# Patient Record
Sex: Female | Born: 1995 | Race: Black or African American | Hispanic: No | Marital: Single | Smoking: Current some day smoker
Health system: Southern US, Community
[De-identification: ages and names within clinical notes are randomized; demographics above are authoritative.]

## PROBLEM LIST (undated history)

## (undated) ENCOUNTER — Inpatient Hospital Stay (HOSPITAL_COMMUNITY): Payer: Self-pay

## (undated) DIAGNOSIS — B9689 Other specified bacterial agents as the cause of diseases classified elsewhere: Secondary | ICD-10-CM

## (undated) DIAGNOSIS — H6690 Otitis media, unspecified, unspecified ear: Secondary | ICD-10-CM

## (undated) DIAGNOSIS — F329 Major depressive disorder, single episode, unspecified: Secondary | ICD-10-CM

## (undated) DIAGNOSIS — F419 Anxiety disorder, unspecified: Secondary | ICD-10-CM

## (undated) DIAGNOSIS — N760 Acute vaginitis: Secondary | ICD-10-CM

## (undated) HISTORY — DX: Anxiety disorder, unspecified: F41.9

## (undated) HISTORY — DX: Major depressive disorder, single episode, unspecified: F32.9

## (undated) HISTORY — DX: Otitis media, unspecified, unspecified ear: H66.90

## (undated) HISTORY — PX: THERAPEUTIC ABORTION: SHX798

## (undated) HISTORY — PX: TYMPANOSTOMY TUBE PLACEMENT: SHX32

---
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
## 2016-04-04 ENCOUNTER — Other Ambulatory Visit: Payer: Self-pay | Admitting: Physician Assistant

## 2016-05-01 ENCOUNTER — Ambulatory Visit: Payer: Self-pay | Admitting: Physician Assistant

## 2016-05-04 ENCOUNTER — Ambulatory Visit: Payer: Self-pay | Admitting: Physician Assistant

## 2016-11-17 ENCOUNTER — Encounter: Payer: Self-pay | Admitting: Physician Assistant

## 2016-11-17 ENCOUNTER — Ambulatory Visit (INDEPENDENT_AMBULATORY_CARE_PROVIDER_SITE_OTHER): Payer: BC Managed Care – PPO | Admitting: Physician Assistant

## 2016-11-17 DIAGNOSIS — F331 Major depressive disorder, recurrent, moderate: Secondary | ICD-10-CM | POA: Diagnosis not present

## 2016-11-17 MED ORDER — SERTRALINE HCL 50 MG PO TABS: 50.0000 mg | ORAL_TABLET | Freq: Every day | ORAL | 1 refills | Status: DC

## 2016-11-18 ENCOUNTER — Encounter: Payer: Self-pay | Admitting: Physician Assistant
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
## 2016-12-16 ENCOUNTER — Ambulatory Visit: Payer: Self-pay

## 2016-12-18 ENCOUNTER — Encounter: Payer: Self-pay | Admitting: Physician Assistant

## 2016-12-18 ENCOUNTER — Ambulatory Visit (INDEPENDENT_AMBULATORY_CARE_PROVIDER_SITE_OTHER): Payer: BLUE CROSS/BLUE SHIELD | Admitting: Physician Assistant

## 2016-12-18 VITALS — BP 116/74 | HR 72 | Wt 266.0 lb

## 2016-12-18 DIAGNOSIS — Z23 Encounter for immunization: Secondary | ICD-10-CM | POA: Diagnosis not present

## 2016-12-18 DIAGNOSIS — L259 Unspecified contact dermatitis, unspecified cause: Secondary | ICD-10-CM | POA: Diagnosis not present

## 2016-12-18 DIAGNOSIS — F331 Major depressive disorder, recurrent, moderate: Secondary | ICD-10-CM | POA: Diagnosis not present

## 2016-12-18 MED ORDER — TRIAMCINOLONE ACETONIDE 0.1 % EX CREA: 1.0000 "application " | TOPICAL_CREAM | Freq: Two times a day (BID) | CUTANEOUS | 1 refills | Status: DC

## 2016-12-18 MED ORDER — SERTRALINE HCL 50 MG PO TABS: 50.0000 mg | ORAL_TABLET | Freq: Every day | ORAL | 2 refills | Status: DC

## 2016-12-18 NOTE — Patient Instructions (Signed)
   Contact Dermatitis Introduction Dermatitis is redness, soreness, and swelling (inflammation) of the skin. Contact dermatitis is a reaction to certain substances that touch the skin. You either touched something that irritated your skin, or you have allergies to something you touched. Follow these instructions at home: Skin Care  Moisturize your skin as needed.  Apply cool compresses to the affected areas.  Try taking a bath with:  Epsom salts. Follow the instructions on the package. You can get these at a pharmacy or grocery store.  Baking soda. Pour a small amount into the bath as told by your doctor.  Colloidal oatmeal. Follow the instructions on the package. You can get this at a pharmacy or grocery store.  Try applying baking soda paste to your skin. Stir water into baking soda until it looks like paste.  Do not scratch your skin.  Bathe less often.  Bathe in lukewarm water. Avoid using hot water. Medicines  Take or apply over-the-counter and prescription medicines only as told by your doctor.  If you were prescribed an antibiotic medicine, take or apply your antibiotic as told by your doctor. Do not stop taking the antibiotic even if your condition starts to get better. General instructions  Keep all follow-up visits as told by your doctor. This is important.  Avoid the substance that caused your reaction. If you do not know what caused it, keep a journal to try to track what caused it. Write down:  What you eat.  What cosmetic products you use.  What you drink.  What you wear in the affected area. This includes jewelry.  If you were given a bandage (dressing), take care of it as told by your doctor. This includes when to change and remove it. Contact a doctor if:  You do not get better with treatment.  Your condition gets worse.  You have signs of infection such as:  Swelling.  Tenderness.  Redness.  Soreness.  Warmth.  You have a fever.  You  have new symptoms. Get help right away if:  You have a very bad headache.  You have neck pain.  Your neck is stiff.  You throw up (vomit).  You feel very sleepy.  You see red streaks coming from the affected area.  Your bone or joint underneath the affected area becomes painful after the skin has healed.  The affected area turns darker.  You have trouble breathing. This information is not intended to replace advice given to you by your health care provider. Make sure you discuss any questions you have with your health care provider. Document Released: 09/13/2009 Document Revised: 04/23/2016 Document Reviewed: 04/03/2015  2017 Elsevier  

## 2016-12-18 NOTE — Progress Notes (Addendum)
   Subjective:    Patient ID: Kara Castillo, female    DOB: 04/11/1996, 21 y.o.   MRN: [id number]  HPI Patient is a 21yo female presenting to the clinic for evaluation of a rash around her neck that started 2 weeks ago.  Patient states the rash appeared after switching body wash to Zest.  Patient states it is itching and inflamed.  Patient states she has now been using Dove body wash.  Patient reports she has eczema on her right flexor surface of her elbow, but the rash on her neck appears different.  Patient states she has tried Eucerin cream, but she still has severe dryness in the area.    Patient reports her depression has improved since starting Zoloft 50mg .  Patient states she has tried exercising more and has been relaxing more and went on vacation.  Patient feels her dose is adequate at this time and does not want to increase her dose at this point.  PHQ-9: 12, GAD-7: 9.          Review of Systems  Skin: Positive for rash.  All other systems reviewed and are negative.      Objective:   Physical Exam  Constitutional: She is oriented to person, place, and time. She appears well-developed and well-nourished.  HENT:  Head: Normocephalic and atraumatic.  Neurological: She is alert and oriented to person, place, and time.  Skin: Skin is warm and dry. Rash (Around neck.  No signs of open lesions.  Appears to be contact dermatitis.) noted. There is erythema.  Psychiatric: She has a normal mood and affect. Her behavior is normal.  Nursing note and vitals reviewed.         Assessment & Plan:  Marland Kitchen.Marland Kitchen.Kara Castillo was seen today for rash on neck.  Diagnoses and all orders for this visit:  Contact dermatitis and eczema -     triamcinolone cream (KENALOG) 0.1 %; Apply 1 application topically 2 (two) times daily.  Major depressive disorder, recurrent episode, moderate (HCC) -     sertraline (ZOLOFT) 50 MG tablet; Take 1 tablet (50 mg total) by mouth daily.  Influenza vaccine needed -     Flu  Vaccine QUAD 36+ mos PF IM (Fluarix & Fluzone Quad PF)   Patient received flu shot today.   Patient was prescribed triamcinolone cream 0.1% for her contact dermatitis and eczema.  Informed patient she can mix this cream with Eucerin.  Side effect of skin atrophy explained.  Patient instructed to avoid scented body washes.  Discontinue Zest body wash. Patient informed that if her rash worsens to call back and she will be treated for a yeast infection.    Patient will continue on Zoloft 50mg .  Patient needs to follow-up in 3 months to reassess her depression.

## 2017-03-09 ENCOUNTER — Encounter: Payer: Self-pay | Admitting: Physician Assistant

## 2017-03-09 ENCOUNTER — Ambulatory Visit (INDEPENDENT_AMBULATORY_CARE_PROVIDER_SITE_OTHER): Payer: BLUE CROSS/BLUE SHIELD | Admitting: Physician Assistant

## 2017-03-09 VITALS — BP 113/70 | HR 64 | Ht 62.5 in | Wt 259.0 lb

## 2017-03-09 DIAGNOSIS — Z113 Encounter for screening for infections with a predominantly sexual mode of transmission: Secondary | ICD-10-CM | POA: Diagnosis not present

## 2017-03-09 DIAGNOSIS — Z23 Encounter for immunization: Secondary | ICD-10-CM | POA: Diagnosis not present

## 2017-03-09 DIAGNOSIS — F331 Major depressive disorder, recurrent, moderate: Secondary | ICD-10-CM | POA: Diagnosis not present

## 2017-03-09 LAB — WET PREP FOR TRICH, YEAST, CLUE
CLUE CELLS WET PREP: NONE SEEN
TRICH WET PREP: NONE SEEN
WBC WET PREP: NONE SEEN
Yeast Wet Prep HPF POC: NONE SEEN

## 2017-03-09 MED ORDER — SERTRALINE HCL 50 MG PO TABS: ORAL_TABLET | ORAL | 2 refills | Status: DC

## 2017-03-09 NOTE — Progress Notes (Signed)
   Subjective:    Patient ID: Kara Castillo, female    DOB: October 25, 1996, 21 y.o.   MRN: [id number]  HPI Pt comes to the clinic for annual STD screening.had sex with 2 new same sex partners over the last year. She does not use any birth control. She [facility] but was having some issues getting appt. No abdominal pain, vaginal discharge or itching.   She is doing better with depression. She is still struggling with motivation and sad feelings. She wants counseling but concerned about cost burden. Denies any suicidal or homicidal thoughts.    Review of Systems  All other systems reviewed and are negative.      Objective:   Physical Exam  Constitutional: She is oriented to person, place, and time. She appears well-developed and well-nourished.  HENT:  Head: Normocephalic and atraumatic.  Cardiovascular: Normal rate, regular rhythm and normal heart sounds.   Pulmonary/Chest: Effort normal and breath sounds normal.  Neurological: She is alert and oriented to person, place, and time.  Psychiatric: She has a normal mood and affect. Her behavior is normal.          Assessment & Plan:  Marland KitchenMarland KitchenDiagnoses and all orders for this visit:  Screening examination for STD (sexually transmitted disease) -     HIV antibody (with reflex) -     RPR -     HSV Type I/II IgG, IgMw/ reflex -     GC/Chlamydia Probe Amp -     WET PREP FOR TRICH, YEAST, CLUE  Major depressive disorder, recurrent episode, moderate (HCC) -     sertraline (ZOLOFT) 50 MG tablet; Take one and one-half tablet daily.  Need for Tdap vaccination -     Tdap vaccine greater than or equal to 7yo IM   .Marland Kitchen Depression screen [facility] 2/9 12/18/2016 03/12/2014  Decreased Interest 1 -  Down, Depressed, Hopeless 1 3  PHQ - 2 Score 2 3  Altered sleeping 3 2  Tired, decreased energy 1 2  Change in appetite 2 3  Feeling bad or failure about yourself  0 3  Trouble concentrating 3 3  Moving slowly or fidgety/restless 1 1   Suicidal thoughts 0 3  PHQ-9 Score 12 20   .Marland Kitchen GAD 7 : Generalized Anxiety Score 12/18/2016  Nervous, Anxious, on Edge 2  Control/stop worrying 1  Worry too much - different things 1  Trouble relaxing 1  Restless 2  Easily annoyed or irritable 2  Afraid - awful might happen 0  Total GAD 7 Score 9    Increased zoloft. Strongly encouraged counseling. Follow up in 3 months. [facility]iscussed [facility] that is cheaper.   STD panel ordered.

## 2017-03-09 NOTE — Progress Notes (Signed)
Call pt: negative for yeast, trich, clue cells.

## 2017-03-10 LAB — HSV(HERPES SIMPLEX VRS) I + II AB-IGG: HSV 2 Glycoprotein G Ab, IgG: <0.9 Index (ref <0.90)

## 2017-03-10 LAB — GC/CHLAMYDIA PROBE AMP
CT Probe RNA: NOT DETECTED
GC PROBE AMP APTIMA: NOT DETECTED

## 2017-03-10 LAB — RPR

## 2017-03-10 LAB — HIV ANTIBODY (ROUTINE TESTING W REFLEX): HIV: NONREACTIVE

## 2017-03-10 NOTE — Progress Notes (Signed)
Call pt: GC/chlamydia negative. HIV negative. Still waiting on HSV.

## 2017-03-12 LAB — HSV 1/2 AB (IGM), IFA W/RFLX TITER
HSV 1 IgM Screen: NEGATIVE
HSV 2 IGM SCREEN: NEGATIVE

## 2017-04-21 ENCOUNTER — Ambulatory Visit (INDEPENDENT_AMBULATORY_CARE_PROVIDER_SITE_OTHER): Payer: BLUE CROSS/BLUE SHIELD | Admitting: Physician Assistant

## 2017-04-21 ENCOUNTER — Encounter: Payer: Self-pay | Admitting: Physician Assistant

## 2017-04-21 VITALS — BP 116/72 | HR 63 | Ht 62.5 in | Wt 260.0 lb

## 2017-04-21 DIAGNOSIS — Z3009 Encounter for other general counseling and advice on contraception: Secondary | ICD-10-CM | POA: Diagnosis not present

## 2017-04-21 DIAGNOSIS — F331 Major depressive disorder, recurrent, moderate: Secondary | ICD-10-CM

## 2017-04-21 MED ORDER — ETONOGESTREL-ETHINYL ESTRADIOL 0.12-0.015 MG/24HR VA RING: VAGINAL_RING | VAGINAL | 12 refills | Status: DC

## 2017-04-21 MED ORDER — SERTRALINE HCL 50 MG PO TABS: ORAL_TABLET | ORAL | 1 refills | Status: DC

## 2017-04-21 NOTE — Progress Notes (Signed)
   Subjective:    Patient ID: Kara Castillo, female    DOB: 09/05/1996, 21 y.o.   MRN: [id number]  HPI Pt is a 21 yo female who presents to the clinic to discuss birth control options. She does not want to take a pill. She has never been on OCP before. She has no hx of blood clots or clotting disorders. She is in an hetersexual relationship and wishes for pregnancy prevention.   Her mood is doing really great on current dose of zoloft. She wishes for a 90 day supply. She is in school and feeling really happy.   .. Active Ambulatory Problems    Diagnosis Date Noted  . Maxillary sinusitis, acute 11/08/2013  . Major depressive disorder, recurrent episode, moderate (HCC) 11/17/2016   Resolved Ambulatory Problems    Diagnosis Date Noted  . No Resolved Ambulatory Problems   Past Medical History:  Diagnosis Date  . Otitis media       Review of Systems  All other systems reviewed and are negative.      Objective:   Physical Exam  Constitutional: She is oriented to person, place, and time. She appears well-developed and well-nourished.  Obese.   HENT:  Head: Normocephalic and atraumatic.  Cardiovascular: Normal rate, regular rhythm and normal heart sounds.   Pulmonary/Chest: Effort normal and breath sounds normal.  Neurological: She is alert and oriented to person, place, and time.  Psychiatric: She has a normal mood and affect. Her behavior is normal.          Assessment & Plan:  Marland Kitchen.Marland Kitchen.Diagnoses and all orders for this visit:  Birth control counseling -     etonogestrel-ethinyl estradiol (NUVARING) 0.12-0.015 MG/24HR vaginal ring; Insert vaginally and leave in place for 3 consecutive weeks, then remove for 1 week.  Major depressive disorder, recurrent episode, moderate (HCC) -     sertraline (ZOLOFT) 50 MG tablet; Take one and one-half tablet daily.   Discussed birth control options with IUD, OCP, depo shot, nexiplanon and nuva ring. Pt chose nuva ring. HO given. Discussed  how to use and when to start. Given rx to start after next cycle. Follow up as needed. Pt aware this does not protect against STD. Encouraged to wear condoms. Pt declined STD testing today.

## 2017-04-21 NOTE — Patient Instructions (Signed)
Ethinyl Estradiol; Etonogestrel vaginal ring What is this medicine? ETHINYL ESTRADIOL; ETONOGESTREL (ETH in il es tra DYE ole; et oh noe JES trel) vaginal ring is a flexible, vaginal ring used as a contraceptive (birth control method). This medicine combines two types of female hormones, an estrogen and a progestin. This ring is used to prevent ovulation and pregnancy. Each ring is effective for one month. This medicine may be used for other purposes; ask your health care provider or pharmacist if you have questions. COMMON BRAND NAME(S): NuvaRing What should I tell my health care provider before I take this medicine? They need to know if you have or ever had any of these conditions: -abnormal vaginal bleeding -blood vessel disease or blood clots -breast, cervical, endometrial, ovarian, liver, or uterine cancer -diabetes -gallbladder disease -heart disease or recent heart attack -high blood pressure -high cholesterol -kidney disease -liver disease -migraine headaches -stroke -systemic lupus erythematosus (SLE) -tobacco smoker -an unusual or allergic reaction to estrogens, progestins, other medicines, foods, dyes, or preservatives -pregnant or trying to get pregnant -breast-feeding How should I use this medicine? Insert the ring into your vagina as directed. Follow the directions on the prescription label. The ring will remain place for 3 weeks and is then removed for a 1-week break. A new ring is inserted 1 week after the last ring was removed, on the same day of the week. Check often to make sure the ring is still in place, especially before and after sexual intercourse. If the ring was out of the vagina for an unknown amount of time, you may not be protected from pregnancy. Perform a pregnancy test and call your doctor. Do not use more often than directed. A patient package insert for the product will be given with each prescription and refill. Read this sheet carefully each time. The  sheet may change frequently. Contact your pediatrician regarding the use of this medicine in children. Special care may be needed. This medicine has been used in female children who have started having menstrual periods. Overdosage: If you think you have taken too much of this medicine contact a poison control center or emergency room at once. NOTE: This medicine is only for you. Do not share this medicine with others. What if I miss a dose? You will need to replace your vaginal ring once a month as directed. If the ring should slip out, or if you leave it in longer or shorter than you should, contact your health care professional for advice. What may interact with this medicine? Do not take this medicine with the following medication: -dasabuvir; ombitasvir; paritaprevir; ritonavir -ombitasvir; paritaprevir; ritonavir This medicine may also interact with the following medications: -acetaminophen -antibiotics or medicines for infections, especially rifampin, rifabutin, rifapentine, and griseofulvin, and possibly penicillins or tetracyclines -aprepitant -ascorbic acid (vitamin C) -atorvastatin -barbiturate medicines, such as phenobarbital -bosentan -carbamazepine -caffeine -clofibrate -cyclosporine -dantrolene -doxercalciferol -felbamate -grapefruit juice -hydrocortisone -medicines for anxiety or sleeping problems, such as diazepam or temazepam -medicines for diabetes, including pioglitazone -modafinil -mycophenolate -nefazodone -oxcarbazepine -phenytoin -prednisolone -ritonavir or other medicines for HIV infection or AIDS -rosuvastatin -selegiline -soy isoflavones supplements -St. John's wort -tamoxifen or raloxifene -theophylline -thyroid hormones -topiramate -warfarin This list may not describe all possible interactions. Give your health care provider a list of all the medicines, herbs, non-prescription drugs, or dietary supplements you use. Also tell them if you smoke,  drink alcohol, or use illegal drugs. Some items may interact with your medicine. What should I watch for while using   this medicine? Visit your doctor or health care professional for regular checks on your progress. You will need a regular breast and pelvic exam and Pap smear while on this medicine. Use an additional method of contraception during the first cycle that you use this ring. Do not use a diaphragm or female condom, as the ring can interfere with these birth control methods and their proper placement. If you have any reason to think you are pregnant, stop using this medicine right away and contact your doctor or health care professional. If you are using this medicine for hormone related problems, it may take several cycles of use to see improvement in your condition. Smoking increases the risk of getting a blood clot or having a stroke while you are using hormonal birth control, especially if you are more than 20 years old. You are strongly advised not to smoke. This medicine can make your body retain fluid, making your fingers, hands, or ankles swell. Your blood pressure can go up. Contact your doctor or health care professional if you feel you are retaining fluid. This medicine can make you more sensitive to the sun. Keep out of the sun. If you cannot avoid being in the sun, wear protective clothing and use sunscreen. Do not use sun lamps or tanning beds/booths. If you wear contact lenses and notice visual changes, or if the lenses begin to feel uncomfortable, consult your eye care specialist. In some women, tenderness, swelling, or minor bleeding of the gums may occur. Notify your dentist if this happens. Brushing and flossing your teeth regularly may help limit this. See your dentist regularly and inform your dentist of the medicines you are taking. If you are going to have elective surgery, you may need to stop using this medicine before the surgery. Consult your health care professional  for advice. This medicine does not protect you against HIV infection (AIDS) or any other sexually transmitted diseases. What side effects may I notice from receiving this medicine? Side effects that you should report to your doctor or health care professional as soon as possible: -breast tissue changes or discharge -changes in vaginal bleeding during your period or between your periods -chest pain -coughing up blood -dizziness or fainting spells -headaches or migraines -leg, arm or groin pain -severe or sudden headaches -stomach pain (severe) -sudden shortness of breath -sudden loss of coordination, especially on one side of the body -speech problems -symptoms of vaginal infection like itching, irritation or unusual discharge -tenderness in the upper abdomen -vomiting -weakness or numbness in the arms or legs, especially on one side of the body -yellowing of the eyes or skin Side effects that usually do not require medical attention (report to your doctor or health care professional if they continue or are bothersome): -breakthrough bleeding and spotting that continues beyond the 3 initial cycles of pills -breast tenderness -mood changes, anxiety, depression, frustration, anger, or emotional outbursts -increased sensitivity to sun or ultraviolet light -nausea -skin rash, acne, or brown spots on the skin -weight gain (slight) This list may not describe all possible side effects. Call your doctor for medical advice about side effects. You may report side effects to FDA at 1-800-FDA-1088. Where should I keep my medicine? Keep out of the reach of children. Store at room temperature between 15 and 30 degrees C (59 and 86 degrees F) for up to 4 months. The product will expire after 4 months. Protect from light. Throw away any unused medicine after the expiration date. NOTE: This   sheet is a summary. It may not cover all possible information. If you have questions about this medicine, talk  to your doctor, pharmacist, or health care provider.  2018 Elsevier/Gold Standard (2016-07-24 17:00:31)  

## 2017-05-03 ENCOUNTER — Ambulatory Visit: Payer: BLUE CROSS/BLUE SHIELD | Admitting: Physician Assistant

## 2017-05-03 DIAGNOSIS — Z0189 Encounter for other specified special examinations: Secondary | ICD-10-CM

## 2017-05-11 ENCOUNTER — Ambulatory Visit: Payer: BLUE CROSS/BLUE SHIELD | Admitting: Physician Assistant

## 2017-05-12 ENCOUNTER — Ambulatory Visit: Payer: BLUE CROSS/BLUE SHIELD | Admitting: Physician Assistant

## 2017-05-14 ENCOUNTER — Ambulatory Visit (INDEPENDENT_AMBULATORY_CARE_PROVIDER_SITE_OTHER): Payer: BLUE CROSS/BLUE SHIELD | Admitting: Physician Assistant

## 2017-05-14 ENCOUNTER — Encounter: Payer: Self-pay | Admitting: Physician Assistant

## 2017-05-14 VITALS — BP 127/80 | HR 74 | Ht 62.5 in | Wt 254.0 lb

## 2017-05-14 DIAGNOSIS — R300 Dysuria: Secondary | ICD-10-CM | POA: Diagnosis not present

## 2017-05-14 DIAGNOSIS — L298 Other pruritus: Secondary | ICD-10-CM | POA: Diagnosis not present

## 2017-05-14 DIAGNOSIS — N898 Other specified noninflammatory disorders of vagina: Secondary | ICD-10-CM

## 2017-05-14 DIAGNOSIS — Z7251 High risk heterosexual behavior: Secondary | ICD-10-CM | POA: Diagnosis not present

## 2017-05-14 LAB — POCT URINALYSIS DIPSTICK
BILIRUBIN UA: NEGATIVE
Blood, UA: NEGATIVE
GLUCOSE UA: NEGATIVE
Ketones, UA: NEGATIVE
NITRITE UA: NEGATIVE
PH UA: 7 (ref 5.0–8.0)
Protein, UA: 30
Spec Grav, UA: 1.02 (ref 1.010–1.025)
Urobilinogen, UA: 1 E.U./dL

## 2017-05-14 LAB — WET PREP FOR TRICH, YEAST, CLUE
Trich, Wet Prep: NONE SEEN
Yeast Wet Prep HPF POC: NONE SEEN

## 2017-05-14 NOTE — Progress Notes (Signed)
   Subjective:    Patient ID: Kara Castillo, female    DOB: 06/20/1996, 21 y.o.   MRN: [id number]  HPI  Pt is a 21 yo female who presents to the clinic with vaginal discharge, itching, and some off and on dysuria. She had STD work up in April and was negative. She has had a new female sexual partner and concerned something could have changed. For 2 weeks she has had off and on discharge. She has a slight odor. Itching is more external. No abdominal pain, fever, chills, n/v/d.   .. Active Ambulatory Problems    Diagnosis Date Noted  . Maxillary sinusitis, acute 11/08/2013  . Major depressive disorder, recurrent episode, moderate (HCC) 11/17/2016   Resolved Ambulatory Problems    Diagnosis Date Noted  . No Resolved Ambulatory Problems   Past Medical History:  Diagnosis Date  . Otitis media        Review of Systems  All other systems reviewed and are negative.      Objective:   Physical Exam  Constitutional: She appears well-developed and well-nourished.  HENT:  Head: Normocephalic and atraumatic.  Cardiovascular: Normal rate, regular rhythm and normal heart sounds.   Pulmonary/Chest: Effort normal and breath sounds normal.  No CVA tenderness.   Abdominal: Soft. Bowel sounds are normal. She exhibits no distension and no mass. There is no tenderness. There is no rebound and no guarding.  Psychiatric: She has a normal mood and affect. Her behavior is normal.          Assessment & Plan:  Marland Kitchen.Marland Kitchen.Vita BarleyKiesha was seen today for vaginal itching and vaginal discharge.  Diagnoses and all orders for this visit:  Vaginal itching -     WET PREP FOR TRICH, YEAST, CLUE  Vaginal discharge -     WET PREP FOR TRICH, YEAST, CLUE -     GC/Chlamydia Probe Amp  Dysuria -     POCT urinalysis dipstick  High risk sexual behavior -     GC/Chlamydia Probe Amp    .Marland Kitchen. Results for orders placed or performed in visit on 05/14/17  POCT urinalysis dipstick  Result Value Ref Range   Color, UA  orange    Clarity, UA slightly cloudy    Glucose, UA neg    Bilirubin, UA neg    Ketones, UA neg    Spec Grav, UA 1.020 1.010 - 1.025   Blood, UA neg    pH, UA 7.0 5.0 - 8.0   Protein, UA 30    Urobilinogen, UA 1.0 0.2 or 1.0 E.U./dL   Nitrite, UA neg    Leukocytes, UA Trace (A) Negative   Not enough to culture. Patient is having on and off dysuria.   Wet prep showed BV. Treated with metronidazole.  Will check for GC/CT.  Encouraged condom usage.  Some increased discharge could be because of the nuva ring.

## 2017-05-14 NOTE — Progress Notes (Signed)
Call pt: BV. This is NOT an STD. This has to do with pH of vagina being to basic. Please send metronidazole 500mg  bid for 7 days. #14

## 2017-05-15 LAB — GC/CHLAMYDIA PROBE AMP
CT Probe RNA: NOT DETECTED
GC Probe RNA: NOT DETECTED

## 2017-05-17 ENCOUNTER — Other Ambulatory Visit: Payer: Self-pay

## 2017-05-17 MED ORDER — METRONIDAZOLE 500 MG PO TABS: 500.0000 mg | ORAL_TABLET | Freq: Two times a day (BID) | ORAL | 0 refills | Status: DC

## 2017-05-25 ENCOUNTER — Telehealth: Payer: Self-pay

## 2017-05-25 NOTE — Telephone Encounter (Signed)
Shouldn't be any interaction. NuvaRing may change the vaginal discharge enough to predispose to BV, but the issue should resolve with treatment. If persists or recurs can consider switching birth control.

## 2017-05-25 NOTE — Telephone Encounter (Signed)
Patient advised of recommendations.  

## 2017-05-25 NOTE — Telephone Encounter (Signed)
Kara Castillo called and asked if there is any interaction while taking Flagyl and having a Nuvaring in place. She was placed on Flagyl for BV. She was prescribed the Flagyl on 05/17/2017 and notified on the same day but she did not start the Flagyl until yesterday. Also would the Nuvaring cause contamination that would prolong the BV.

## 2017-06-07 ENCOUNTER — Ambulatory Visit: Payer: BLUE CROSS/BLUE SHIELD | Admitting: Physician Assistant

## 2017-06-08 ENCOUNTER — Ambulatory Visit (INDEPENDENT_AMBULATORY_CARE_PROVIDER_SITE_OTHER): Payer: BLUE CROSS/BLUE SHIELD | Admitting: Physician Assistant

## 2017-06-08 ENCOUNTER — Ambulatory Visit: Payer: Self-pay | Admitting: Physician Assistant

## 2017-06-08 ENCOUNTER — Encounter: Payer: Self-pay | Admitting: Physician Assistant

## 2017-06-08 VITALS — BP 102/66 | HR 68 | Temp 97.8°F | Wt 253.0 lb

## 2017-06-08 DIAGNOSIS — H66012 Acute suppurative otitis media with spontaneous rupture of ear drum, left ear: Secondary | ICD-10-CM

## 2017-06-08 MED ORDER — OFLOXACIN 0.3 % OT SOLN: 5.0000 [drp] | Freq: Two times a day (BID) | OTIC | 0 refills | Status: DC

## 2017-06-08 MED ORDER — AZITHROMYCIN 250 MG PO TABS: ORAL_TABLET | ORAL | 0 refills | Status: DC

## 2017-06-08 NOTE — Progress Notes (Signed)
HPI:                                                                Kara AngerKiesha Helm is a 21 y.o. female who [facility] today for left ear pain  Otalgia   There is pain in the left ear. This is a new problem. The current episode started in the past 7 days. The problem occurs constantly. The problem has been unchanged. There has been no fever. Associated symptoms include ear discharge (yesterday) and hearing loss. Pertinent negatives include no coughing, headaches, neck pain, rhinorrhea or sore throat. She has tried nothing for the symptoms. There is no history of a chronic ear infection, hearing loss or a tympanostomy tube.     Past Medical History:  Diagnosis Date  . Otitis media    Past Surgical History:  Procedure Laterality Date  . TYMPANOSTOMY TUBE PLACEMENT     Social History  Substance Use Topics  . Smoking status: Never Smoker  . Smokeless tobacco: Never Used  . Alcohol use Not on file   family history includes Hypertension in her maternal grandmother.  ROS: negative except as noted in the HPI  Medications: Current Outpatient Prescriptions  Medication Sig Dispense Refill  . etonogestrel-ethinyl estradiol (NUVARING) 0.12-0.015 MG/24HR vaginal ring Insert vaginally and leave in place for 3 consecutive weeks, then remove for 1 week. 1 each 12  . sertraline (ZOLOFT) 50 MG tablet Take one and one-half tablet daily. 135 tablet 1  . triamcinolone cream (KENALOG) 0.1 % Apply 1 application topically 2 (two) times daily. 30 g 1   No current facility-administered medications for this visit.    Allergies  Allergen Reactions  . Penicillins        Objective:  BP 102/66   Pulse 68   Temp 97.8 F (36.6 C) (Oral)   Wt 253 lb (114.8 kg)   SpO2 100%   BMI 45.54 kg/m  Gen: well-groomed, cooperative, not ill-appearing, no distress HEENT: normal conjunctiva, right external canal normal and TM clear, left external  ear without tragus or auricular tenderness, no mastoid tenderness, right TM erythematous with effusion Pulm: Normal work of breathing, normal phonation Neuro: alert and oriented x 3, EOM's intact, no tremor MSK: moving all extremities, normal gait and station, no peripheral edema Skin: warm, dry, intact; no rashes or lesions on exposed skin   No results found for this or any previous visit (from the past 72 hour(s)). No results found.    Assessment and Plan: 21 y.o. female with   1. Acute suppurative otitis media of left ear with spontaneous rupture of tympanic membrane, recurrence not specified - azithromycin (ZITHROMAX Z-PAK) 250 MG tablet; Take 2 tablets (500 mg) on  Day 1,  followed by 1 tablet (250 mg) once daily on Days 2 through 5.  Dispense: 6 tablet; Refill: 0 - ofloxacin (FLOXIN) 0.3 % OTIC solution; Place 5 drops into the left ear 2 (two) times daily.  Dispense: 5 mL; Refill: 0 - Ibuprofen 800mg  Q8H prn for pain - follow-up if hearing does not return to baseline  Patient education and anticipatory guidance given Patient agrees with treatment plan Follow-up as needed if symptoms worsen or fail to improve  Levonne Hubertharley E. Cummings PA-C

## 2017-06-08 NOTE — Patient Instructions (Addendum)
- Take antibiotic for 5 days as prescribed - Put 5 drops in your left ear twice a day for 10 days - Ibuprofen 800mg  every 8 hours as needed for pain. May also do Tylenol 1000mg  every 8 hours - Follow-up if hearing does not return to normal   Otitis Media, Adult Otitis media occurs when there is inflammation and fluid in the middle ear. Your middle ear is a part of the ear that contains bones for hearing as well as air that helps send sounds to your brain. What are the causes? This condition is caused by a blockage in the eustachian tube. This tube drains fluid from the ear to the back of the nose (nasopharynx). A blockage in this tube can be caused by an object or by swelling (edema) in the tube. Problems that can cause a blockage include:  A cold or other upper respiratory infection.  Allergies.  An irritant, such as tobacco smoke.  Enlarged adenoids. The adenoids are areas of soft tissue located high in the back of the throat, behind the nose and the roof of the mouth.  A mass in the nasopharynx.  Damage to the ear caused by pressure changes (barotrauma).  What are the signs or symptoms? Symptoms of this condition include:  Ear pain.  A fever.  Decreased hearing.  A headache.  Tiredness (lethargy).  Fluid leaking from the ear.  Ringing in the ear.  How is this diagnosed? This condition is diagnosed with a physical exam. During the exam your health care provider will use an instrument called an otoscope to look into your ear and check for redness, swelling, and fluid. He or she will also ask about your symptoms. Your health care provider may also order tests, such as:  A test to check the movement of the eardrum (pneumatic otoscopy). This test is done by squeezing a small amount of air into the ear.  A test that changes air pressure in the middle ear to check how well the eardrum moves and whether the eustachian tube is working (tympanogram).  How is this  treated? This condition usually goes away on its own within 3-5 days. But if the condition is caused by a bacteria infection and does not go away own its own, or keeps coming back, your health care provider may:  Prescribe antibiotic medicines to treat the infection.  Prescribe or recommend medicines to control pain.  Follow these instructions at home:  Take over-the-counter and prescription medicines only as told by your health care provider.  If you were prescribed an antibiotic medicine, take it as told by your health care provider. Do not stop taking the antibiotic even if you start to feel better.  Keep all follow-up visits as told by your health care provider. This is important. Contact a health care provider if:  You have bleeding from your nose.  There is a lump on your neck.  You are not getting better in 5 days.  You feel worse instead of better. Get help right away if:  You have severe pain that is not controlled with medicine.  You have swelling, redness, or pain around your ear.  You have stiffness in your neck.  A part of your face is paralyzed.  The bone behind your ear (mastoid) is tender when you touch it.  You develop a severe headache. Summary  Otitis media is redness, soreness, and swelling of the middle ear.  This condition usually goes away on its own within 3-5  days.  If the problem does not go away in 3-5 days, your health care provider may prescribe or recommend medicines to treat your symptoms.  If you were prescribed an antibiotic medicine, take it as told by your health care provider. This information is not intended to replace advice given to you by your health care provider. Make sure you discuss any questions you have with your health care provider. Document Released: 08/21/2004 Document Revised: 11/06/2016 Document Reviewed: 11/06/2016 Elsevier Interactive Patient Education  2017 [employer]

## 2017-09-03 ENCOUNTER — Ambulatory Visit: Payer: Self-pay

## 2017-09-08 ENCOUNTER — Ambulatory Visit: Payer: Self-pay

## 2017-09-14 ENCOUNTER — Ambulatory Visit: Payer: Self-pay

## 2017-09-15 ENCOUNTER — Ambulatory Visit (INDEPENDENT_AMBULATORY_CARE_PROVIDER_SITE_OTHER): Payer: Self-pay | Admitting: Family Medicine

## 2017-09-15 VITALS — BP 118/69 | HR 53 | Wt 255.0 lb

## 2017-09-15 DIAGNOSIS — Z111 Encounter for screening for respiratory tuberculosis: Secondary | ICD-10-CM

## 2017-09-15 NOTE — Progress Notes (Signed)
Pt in today for ppd placement for work.  Given in right forearm with no immediate complications.  Pt instructed to return in 48 hrs to ppd reading.

## 2017-09-17 ENCOUNTER — Ambulatory Visit (INDEPENDENT_AMBULATORY_CARE_PROVIDER_SITE_OTHER): Payer: Self-pay | Admitting: Sports Medicine

## 2017-09-17 VITALS — Wt 253.0 lb

## 2017-09-17 DIAGNOSIS — Z111 Encounter for screening for respiratory tuberculosis: Secondary | ICD-10-CM

## 2017-09-17 LAB — TB SKIN TEST
Induration: 0 mm
TB SKIN TEST: NEGATIVE

## 2017-09-17 NOTE — Progress Notes (Signed)
Patient is here for Tb skin read. TB  Placement was 48 hours ago. Skin test was negative. The patient did not have a form to be filled out.

## 2017-12-27 ENCOUNTER — Ambulatory Visit: Payer: Self-pay | Admitting: Physician Assistant

## 2017-12-27 DIAGNOSIS — Z0189 Encounter for other specified special examinations: Secondary | ICD-10-CM

## 2018-07-27 LAB — HM PAP SMEAR: HM Pap smear: ABNORMAL

## 2018-09-21 ENCOUNTER — Ambulatory Visit: Payer: Self-pay | Admitting: Physician Assistant

## 2018-10-05 ENCOUNTER — Encounter: Payer: Self-pay | Admitting: Physician Assistant

## 2018-10-05 ENCOUNTER — Telehealth: Payer: Self-pay | Admitting: Physician Assistant

## 2018-10-05 ENCOUNTER — Ambulatory Visit: Payer: BC Managed Care – PPO | Admitting: Physician Assistant

## 2018-10-05 ENCOUNTER — Other Ambulatory Visit: Payer: Self-pay | Admitting: Physician Assistant

## 2018-10-05 VITALS — BP 121/80 | HR 75 | Ht 62.5 in | Wt 277.0 lb

## 2018-10-05 DIAGNOSIS — R87610 Atypical squamous cells of undetermined significance on cytologic smear of cervix (ASC-US): Secondary | ICD-10-CM

## 2018-10-05 DIAGNOSIS — F331 Major depressive disorder, recurrent, moderate: Secondary | ICD-10-CM | POA: Diagnosis not present

## 2018-10-05 DIAGNOSIS — Z3009 Encounter for other general counseling and advice on contraception: Secondary | ICD-10-CM

## 2018-10-05 DIAGNOSIS — R8290 Unspecified abnormal findings in urine: Secondary | ICD-10-CM | POA: Diagnosis not present

## 2018-10-05 DIAGNOSIS — N898 Other specified noninflammatory disorders of vagina: Secondary | ICD-10-CM | POA: Diagnosis not present

## 2018-10-05 DIAGNOSIS — R1030 Lower abdominal pain, unspecified: Secondary | ICD-10-CM

## 2018-10-05 LAB — POCT URINALYSIS DIPSTICK
BILIRUBIN UA: NEGATIVE
GLUCOSE UA: NEGATIVE
Ketones, UA: NEGATIVE
NITRITE UA: NEGATIVE
Protein, UA: NEGATIVE
Urobilinogen, UA: 0.2 E.U./dL
pH, UA: 5.5 (ref 5.0–8.0)

## 2018-10-05 MED ORDER — SERTRALINE HCL 50 MG PO TABS
ORAL_TABLET | ORAL | 1 refills | Status: DC
Start: 2018-10-05 — End: 2019-05-24

## 2018-10-05 MED ORDER — NITROFURANTOIN MONOHYD MACRO 100 MG PO CAPS: 100.0000 mg | ORAL_CAPSULE | Freq: Two times a day (BID) | ORAL | 0 refills | Status: DC

## 2018-10-05 MED ORDER — ETONOGESTREL-ETHINYL ESTRADIOL 0.12-0.015 MG/24HR VA RING
VAGINAL_RING | VAGINAL | 12 refills | Status: DC
Start: 2018-10-05 — End: 2019-03-15

## 2018-10-05 MED ORDER — METRONIDAZOLE 500 MG PO TABS: 500.0000 mg | ORAL_TABLET | Freq: Two times a day (BID) | ORAL | 0 refills | Status: DC

## 2018-10-05 NOTE — Telephone Encounter (Signed)
Need to get pap report from [facility] [address]

## 2018-10-05 NOTE — Progress Notes (Signed)
Subjective:    Patient ID: Kara Castillo, female    DOB: 07-27-1996, 22 y.o.   MRN: [id number]  HPI  Pt is a 22 yo female who is sexually active presenting to the clinic with vaginal discharge, odor, abdominal pain, irritation.   She finished her menstrual cycle October 14 through 20th. She started having symptoms around 1 week ago with discharge and odor. She is now having abdominal pain as well. She uses nuva ring for birth control. She does need refill. Last pap this year. No fever, chills, body aches. C/O some urine odor.   Depression is doing well. She continues to go through counseling. No SI/HC. She is thinking about going back to school in the near future.   .. Active Ambulatory Problems    Diagnosis Date Noted  . Maxillary sinusitis, acute 11/08/2013  . Major depressive disorder, recurrent episode, moderate (HCC) 11/17/2016  . Birth control counseling 10/05/2018  . Lower abdominal pain 10/05/2018   Resolved Ambulatory Problems    Diagnosis Date Noted  . No Resolved Ambulatory Problems   Past Medical History:  Diagnosis Date  . Otitis media         Review of Systems See HPI.     Objective:   Physical Exam  Constitutional: She appears well-nourished.  Obese.   Cardiovascular: Normal rate.  Pulmonary/Chest:  No CVA tenderness.   Abdominal: Soft. Bowel sounds are normal. She exhibits no distension. There is tenderness (suprapubic). There is no rebound and no guarding.  Neurological: She is alert.  Skin: No rash noted.          Assessment & Plan:  Marland KitchenMarland KitchenDiagnoses and all orders for this visit:  Vaginal itching -     metroNIDAZOLE (FLAGYL) 500 MG tablet; Take 1 tablet (500 mg total) by mouth 2 (two) times daily. For 7 days. -     Wet prep, genital -     C. trachomatis/N. gonorrhoeae RNA  Major depressive disorder, recurrent episode, moderate (HCC) -     sertraline (ZOLOFT) 50 MG tablet; Take one and one-half tablet daily.  Birth control counseling -      etonogestrel-ethinyl estradiol (NUVARING) 0.12-0.015 MG/24HR vaginal ring; Insert vaginally and leave in place for 3 consecutive weeks, then remove for 1 week.  Abnormal urine odor -     nitrofurantoin, macrocrystal-monohydrate, (MACROBID) 100 MG capsule; Take 1 capsule (100 mg total) by mouth 2 (two) times daily. For 7 days. -     POCT Urinalysis Dipstick -     Urine Culture  Lower abdominal pain -     nitrofurantoin, macrocrystal-monohydrate, (MACROBID) 100 MG capsule; Take 1 capsule (100 mg total) by mouth 2 (two) times daily. For 7 days. -     Wet prep, genital -     C. trachomatis/N. gonorrhoeae RNA  Vaginal discharge -     metroNIDAZOLE (FLAGYL) 500 MG tablet; Take 1 tablet (500 mg total) by mouth 2 (two) times daily. For 7 days. -     Wet prep, genital -     C. trachomatis/N. gonorrhoeae RNA    .Marland Kitchen Depression screen [facility] 2/9 10/05/2018 12/18/2016 03/12/2014  Decreased Interest 1 1 -  Down, Depressed, Hopeless 1 1 3   PHQ - 2 Score 2 2 3   Altered sleeping 1 3 2   Tired, decreased energy 1 1 2   Change in appetite 1 2 3   Feeling bad or failure about yourself  0 0 3  Trouble concentrating 0 3 3  Moving slowly or fidgety/restless  0 1 1  Suicidal thoughts 0 0 3  PHQ-9 Score 5 12 20   Difficult doing work/chores Not difficult at all - -    Results for orders placed or performed in visit on 10/05/18  POCT Urinalysis Dipstick  Result Value Ref Range   Color, UA yellow    Clarity, UA clear    Glucose, UA Negative Negative   Bilirubin, UA negative    Ketones, UA negative    Spec Grav, UA >=1.030 (A) 1.010 - 1.025   Blood, UA trace-lysed    pH, UA 5.5 5.0 - 8.0   Protein, UA Negative Negative   Urobilinogen, UA 0.2 0.2 or 1.0 E.U./dL   Nitrite, UA negative    Leukocytes, UA Small (1+) (A) Negative   Appearance     Odor     Will culture. Due to abdominal pain and symptoms started treatment with macrobid and will adjust as needed.   STI testing ordered. Wet prep  ordered.  Will treat empirically for BV and UTI.   Pap done this year with [facility]. Refilled nuva ring.   Depression controlled. Refilled zoloft.

## 2018-10-06 LAB — WET PREP FOR TRICH, YEAST, CLUE
MICRO NUMBER:: 91339288
SPECIMEN QUALITY 3963: ADEQUATE

## 2018-10-06 NOTE — Progress Notes (Signed)
Call pt: negative for any STI's.

## 2018-10-06 NOTE — Telephone Encounter (Signed)
Done. Report information sent to office.

## 2018-10-07 LAB — URINE CULTURE
MICRO NUMBER: 91339132
RESULT: NO GROWTH
SPECIMEN QUALITY: ADEQUATE

## 2018-10-07 LAB — C. TRACHOMATIS/N. GONORRHOEAE RNA
C. trachomatis RNA, TMA: NOT DETECTED
N. GONORRHOEAE RNA, TMA: NOT DETECTED

## 2018-10-07 NOTE — Progress Notes (Signed)
No growth on urine culture. How are symptoms?

## 2018-10-12 DIAGNOSIS — R87610 Atypical squamous cells of undetermined significance on cytologic smear of cervix (ASC-US): Secondary | ICD-10-CM | POA: Insufficient documentation

## 2018-10-12 NOTE — Telephone Encounter (Signed)
Call pt: pap results from other provider did show atypical cells of undetermined significance. Meaning not abnormal yet but we need to keep a closer eye on these cells follow up in 1 year for pap.

## 2018-10-13 NOTE — Telephone Encounter (Signed)
Called and notified patient of results and that her next pap will be in 1 year. No further questions or concerns at this time.

## 2018-10-21 ENCOUNTER — Encounter: Payer: Self-pay | Admitting: Physician Assistant

## 2018-10-21 ENCOUNTER — Ambulatory Visit (INDEPENDENT_AMBULATORY_CARE_PROVIDER_SITE_OTHER): Payer: BC Managed Care – PPO | Admitting: Physician Assistant

## 2018-10-21 VITALS — BP 119/76 | HR 92 | Ht 62.5 in | Wt 284.0 lb

## 2018-10-21 DIAGNOSIS — R42 Dizziness and giddiness: Secondary | ICD-10-CM | POA: Diagnosis not present

## 2018-10-21 DIAGNOSIS — R599 Enlarged lymph nodes, unspecified: Secondary | ICD-10-CM | POA: Diagnosis not present

## 2018-10-21 MED ORDER — FLUCONAZOLE 150 MG PO TABS: 150.0000 mg | ORAL_TABLET | Freq: Once | ORAL | 0 refills | Status: AC

## 2018-10-21 MED ORDER — FLUTICASONE PROPIONATE 50 MCG/ACT NA SUSP: 2.0000 | Freq: Every day | NASAL | 6 refills | Status: DC

## 2018-10-21 NOTE — Addendum Note (Signed)
Addended by: Jomarie LongsBREEBACK, JADE L on: 10/21/2018 12:02 PM   Modules accepted: Orders

## 2018-10-21 NOTE — Progress Notes (Signed)
   Subjective:    Patient ID: Kara Castillo, female    DOB: 05/28/1996, 22 y.o.   MRN: [id number]  HPI Patient is a 22 year old female presenting today with complaints of a painful lump in front of her ear and intermittent dizziness. The lump is small and tender to the touch, located just in front of and above her tragus. She also complains of intermittent dizziness, but denies any triggers. The dizziness does not make her nauseous or unstable, just makes her feel "off". She does note that it can happen when she stands up quickly, but also happens at random times as well. She also mentioned that her ears have felt "yucky" as of lately, but is not currently experiencing any pain or discharge.  .. Active Ambulatory Problems    Diagnosis Date Noted  . Maxillary sinusitis, acute 11/08/2013  . Major depressive disorder, recurrent episode, moderate (HCC) 11/17/2016  . Birth control counseling 10/05/2018  . Lower abdominal pain 10/05/2018  . Atypical squamous cells of undetermined significance (ASCUS) on Papanicolaou smear of cervix 10/12/2018   Resolved Ambulatory Problems    Diagnosis Date Noted  . No Resolved Ambulatory Problems   Past Medical History:  Diagnosis Date  . Otitis media      Review of Systems  Constitutional: Negative for chills, fatigue and fever.  HENT: Negative for congestion, ear discharge, ear pain, hearing loss, sinus pressure and sinus pain.   Neurological: Positive for dizziness. Negative for syncope.       Objective:   Physical Exam  Constitutional: She is oriented to person, place, and time. She appears well-developed and well-nourished.  HENT:  Head: Normocephalic and atraumatic.  Right Ear: External ear normal.  Left Ear: External ear normal.  Nose: Nose normal.  Mouth/Throat: No oropharyngeal exudate.  TM's clear. Scant amt of fluid behind TM bilaterally.  Negative for any tenderness over sinuses.  Bilateral enlarged tonsils but not exudate.  Small  pea sized firm slightly tender but not warm nodule just anterior to tragus of right ear.   Eyes: Conjunctivae are normal.  Neck: No thyromegaly present.  Cardiovascular: Normal rate and regular rhythm.  Pulmonary/Chest: Effort normal and breath sounds normal.  Lymphadenopathy:    She has no cervical adenopathy.  Neurological: She is alert and oriented to person, place, and time.  Negative Dix-hallpike bilaterally.   Psychiatric: She has a normal mood and affect. Her behavior is normal.          Assessment & Plan:  Marland Kitchen.Marland Kitchen.Diagnoses and all orders for this visit:  Lymph node enlargement  Dizziness -     fluticasone (FLONASE) 50 MCG/ACT nasal spray; Place 2 sprays into both nostrils daily.   I do think "bump" represents and pre-auricular lymph node. Dicussed warm compresses and ibuprofen for relief. Start flonase for some possible ETD. Negative Gilberto BetterDix Hallpike today in office. Certainly could be the beginning of URI. Consider tylenol cold sinus severe for URI symptoms. Follow up as needed. Try not to touch lymph node frequently. If not improving or worsening let me know.   Marland Kitchen.Harlon Flor.I, Taziah Difatta PA-C, have reviewed and agree with the above documentation in it's entirety.

## 2018-10-21 NOTE — Progress Notes (Signed)
Call pt: yeast and bacterial vaginosis. Will send over diflucan and metronidazole. I think this is an old result. I had already sent metronidazole. Diflucan added. Not sure what happened.

## 2018-11-11 NOTE — Telephone Encounter (Signed)
Marylene LandAngela, please abstract pap from [facility].

## 2018-11-14 ENCOUNTER — Encounter: Payer: Self-pay | Admitting: Physician Assistant

## 2018-11-14 NOTE — Telephone Encounter (Signed)
Abstracted from [facility]  

## 2018-11-15 ENCOUNTER — Ambulatory Visit: Payer: Self-pay | Admitting: Physician Assistant

## 2018-11-15 ENCOUNTER — Telehealth: Payer: Self-pay | Admitting: Physician Assistant

## 2018-11-15 NOTE — Telephone Encounter (Signed)
Patient called at 2:49 stating that this was her first opportunity to call about her appt today. She was stuck at work and unable to keep her appointment.

## 2018-11-15 NOTE — Telephone Encounter (Signed)
Ok to cancel so she doesn't get charged.

## 2018-11-28 ENCOUNTER — Ambulatory Visit (INDEPENDENT_AMBULATORY_CARE_PROVIDER_SITE_OTHER): Payer: BC Managed Care – PPO | Admitting: Physician Assistant

## 2018-11-28 ENCOUNTER — Encounter: Payer: Self-pay | Admitting: Physician Assistant

## 2018-11-28 VITALS — BP 123/77 | HR 75 | Ht 62.5 in | Wt 277.0 lb

## 2018-11-28 DIAGNOSIS — H6983 Other specified disorders of Eustachian tube, bilateral: Secondary | ICD-10-CM | POA: Diagnosis not present

## 2018-11-28 DIAGNOSIS — R599 Enlarged lymph nodes, unspecified: Secondary | ICD-10-CM

## 2018-11-28 MED ORDER — METHYLPREDNISOLONE 4 MG PO TBPK: ORAL_TABLET | ORAL | 0 refills | Status: DC

## 2018-11-28 NOTE — Progress Notes (Signed)
   Subjective:    Patient ID: Cassandria AngerKiesha Frisina, female    DOB: 11/21/1996, 22 y.o.   MRN: [id number]  HPI Patient is a 22 year old female who presents to the clinic to follow-up on lymph node in front of right ear. She was last seen on 11/22.  She admits she has been having this intermittently flare.  Today it is gone.  She denies any tenderness or bumps palpated today but last week she did have the same "bump" flare  She does report some bilateral ear popping and tight feeling.  She denies any sore throat, ear pain, headache.    .. Active Ambulatory Problems    Diagnosis Date Noted  . Maxillary sinusitis, acute 11/08/2013  . Major depressive disorder, recurrent episode, moderate (HCC) 11/17/2016  . Birth control counseling 10/05/2018  . Lower abdominal pain 10/05/2018  . Atypical squamous cells of undetermined significance (ASCUS) on Papanicolaou smear of cervix 10/12/2018   Resolved Ambulatory Problems    Diagnosis Date Noted  . No Resolved Ambulatory Problems   Past Medical History:  Diagnosis Date  . Otitis media       Review of Systems    see HPI.  Objective:   Physical Exam Vitals signs reviewed.  Constitutional:      Appearance: Normal appearance.  HENT:     Head: Normocephalic and atraumatic.     Right Ear: Ear canal and external ear normal.     Left Ear: Ear canal and external ear normal.     Ears:     Comments: Bilateral fluid behind TM. No signs of infection.     Nose: Nose normal.     Mouth/Throat:     Mouth: Mucous membranes are moist.  Cardiovascular:     Rate and Rhythm: Normal rate and regular rhythm.  Neurological:     General: No focal deficit present.     Mental Status: She is alert and oriented to person, place, and time.  Psychiatric:        Mood and Affect: Mood normal.        Behavior: Behavior normal.           Assessment & Plan:  Marland Kitchen.Marland Kitchen.Vita BarleyKiesha was seen today for follow-up.  Diagnoses and all orders for this visit:  Lymph node  enlargement -     methylPREDNISolone (MEDROL DOSEPAK) 4 MG TBPK tablet; Take as directed by package insert  ETD (Eustachian tube dysfunction), bilateral -     methylPREDNISolone (MEDROL DOSEPAK) 4 MG TBPK tablet; Take as directed by package insert  pt has signs and symptoms consisent with ETD. Treated with medrol dose pack and flonase.  Reassured patient about lymph node resolving.  Likely this is more a reactive lymph node in response to some of the eustachian tube dysfunction.  Continue to keep an eye on this.  Discussed warning and worrisome signs and symptoms of lymph nodes that will not resolve.  Follow-up as needed or with any new signs and symptoms.

## 2018-12-02 ENCOUNTER — Telehealth: Payer: Self-pay

## 2018-12-02 NOTE — Telephone Encounter (Signed)
Ok to write letter. To state patient has dx of MDD and treated with medications that have placed MDD in remission. Place in box and I will sign.

## 2018-12-02 NOTE — Telephone Encounter (Signed)
Patient called and is requesting a letter stating that she has been diagnosed with depression and that she is currently being treated by provider with medications. Routing to PCP to make sure no specific wording is needed or should be used. Thanks!

## 2018-12-05 NOTE — Telephone Encounter (Signed)
Signed and done 

## 2018-12-05 NOTE — Telephone Encounter (Signed)
Letter has been written and has been placed in box. I will contact patient to let her know it should be ready for pick-up by sometime this week. Thanks!

## 2018-12-09 ENCOUNTER — Encounter: Payer: Self-pay | Admitting: Physician Assistant

## 2018-12-09 ENCOUNTER — Ambulatory Visit (INDEPENDENT_AMBULATORY_CARE_PROVIDER_SITE_OTHER): Payer: BC Managed Care – PPO | Admitting: Physician Assistant

## 2018-12-09 VITALS — BP 143/78 | HR 66 | Temp 98.3°F | Ht 62.0 in | Wt 283.0 lb

## 2018-12-09 DIAGNOSIS — B9789 Other viral agents as the cause of diseases classified elsewhere: Secondary | ICD-10-CM

## 2018-12-09 DIAGNOSIS — J069 Acute upper respiratory infection, unspecified: Secondary | ICD-10-CM | POA: Diagnosis not present

## 2018-12-09 DIAGNOSIS — R1011 Right upper quadrant pain: Secondary | ICD-10-CM

## 2018-12-09 MED ORDER — HYDROCODONE-HOMATROPINE 5-1.5 MG/5ML PO SYRP: 5.0000 mL | ORAL_SOLUTION | Freq: Three times a day (TID) | ORAL | 0 refills | Status: DC | PRN

## 2018-12-09 MED ORDER — BENZONATATE 200 MG PO CAPS: 200.0000 mg | ORAL_CAPSULE | Freq: Two times a day (BID) | ORAL | 0 refills | Status: DC | PRN

## 2018-12-09 NOTE — Progress Notes (Signed)
Subjective:    Patient ID: Kara Castillo, female    DOB: Apr 28, 1996, 23 y.o.   MRN: [id number]  HPI: This is a 23 year old female who presents today with a chief complaint of a productive cough for 4 days. She states that the cough keeps her up at night and is interfering with sleep. She also complains of nausea, sore throat, sneezing, rhinorrhea, and mild diarrhea. 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 She denies fever, myalgia, chills, and fatigue. She has tried OTC tussin which has mildly helped her cough.   She also complains of abdominal pain 1 hour in duration. She has not eaten anything today and the pain is located in her RUQ. She has never experienced pain like this before. She denies any vomiting. She does have a family hx of family members getting their gallbladders removed.   .. Active Ambulatory Problems    Diagnosis Date Noted  . Maxillary sinusitis, acute 11/08/2013  . Major depressive disorder, recurrent episode, moderate (HCC) 11/17/2016  . Birth control counseling 10/05/2018  . Lower abdominal pain 10/05/2018  . Atypical squamous cells of undetermined significance (ASCUS) on Papanicolaou smear of cervix 10/12/2018   Resolved Ambulatory Problems    Diagnosis Date Noted  . No Resolved Ambulatory Problems   Past Medical History:  Diagnosis Date  . Otitis media       Review of Systems  Constitutional: Negative for activity change, appetite change, chills, fatigue and fever.  HENT: Positive for congestion, rhinorrhea, sneezing and sore throat.   Respiratory: Positive for cough. Negative for chest tightness, shortness of breath and wheezing.   Cardiovascular: Negative for chest pain.  Gastrointestinal: Positive for abdominal pain, diarrhea and nausea. Negative for constipation and vomiting.  Genitourinary: Negative for difficulty urinating and dysuria.  Musculoskeletal: Negative for arthralgias and myalgias.       Objective:   Physical Exam Constitutional:      Appearance: She is not ill-appearing.  HENT:     Right Ear: Tympanic membrane and ear canal normal.     Left Ear: Tympanic membrane and ear canal normal.     Nose: Congestion and rhinorrhea present.     Mouth/Throat:     Mouth: Mucous membranes are moist.     Pharynx: No pharyngeal swelling, oropharyngeal exudate, posterior oropharyngeal erythema or uvula swelling.     Tonsils: No tonsillar exudate. Swelling: 2+ on the right. 2+ on the left.  Eyes:     Conjunctiva/sclera: Conjunctivae normal.  Cardiovascular:     Heart sounds: Normal heart sounds.  Pulmonary:     Effort: Pulmonary effort is normal.     Breath sounds: Normal breath sounds. No wheezing or rhonchi.  Abdominal:     General: Bowel sounds are normal.     Palpations: Abdomen is soft.     Tenderness: There is abdominal tenderness.  Skin:    General: Skin is warm and dry.  Neurological:     Mental Status: She is alert.           Assessment & Plan:  Marland KitchenMarland KitchenLynna was seen today for sore throat and cough.  Diagnoses and all orders for this visit:  Viral URI with cough -     benzonatate (TESSALON) 200 MG capsule; Take 1 capsule (200 mg total) by mouth 2 (two) times daily as needed for cough. -     HYDROcodone-homatropine (HYCODAN) 5-1.5 MG/5ML syrup; Take 5 mLs by mouth every 8 (eight) hours as needed for  cough.  Right upper quadrant abdominal pain    Reassured appears viral no signs of bacterial infection for now.  -Tessalon 100mg  TID PRN for cough  -Hycodan 5mg /1.5mg  tab QHS for cough. Small quanity for bedtime relief for cough. -Oral rehydration.  -Contact office if symptoms not improved by Monday  RUQ pain: given patient history of coughing likely etiology, as well as recent changes in diet (vegan).  -discussed w/ patient possibility of pain being gall bladder in origin. However, pain is not induced after eating and is not associated with intense pain, vomiting, or bloating.  Instructed patient to follow up should she experience this type of pain again or to go to the ED should the pain  be increased/intense.   Marland Kitchen.Harlon Flor.I, [name] PA-C, have reviewed and agree with the above documentation in it's entirety.

## 2018-12-10 ENCOUNTER — Encounter: Payer: Self-pay | Admitting: Physician Assistant

## 2019-01-30 ENCOUNTER — Ambulatory Visit: Payer: Self-pay | Admitting: Physician Assistant

## 2019-01-31 ENCOUNTER — Encounter: Payer: Self-pay | Admitting: Physician Assistant

## 2019-01-31 ENCOUNTER — Ambulatory Visit (INDEPENDENT_AMBULATORY_CARE_PROVIDER_SITE_OTHER): Payer: 59 | Admitting: Physician Assistant

## 2019-01-31 VITALS — BP 122/75 | HR 81 | Temp 98.4°F | Wt 277.8 lb

## 2019-01-31 DIAGNOSIS — L309 Dermatitis, unspecified: Secondary | ICD-10-CM

## 2019-01-31 DIAGNOSIS — N898 Other specified noninflammatory disorders of vagina: Secondary | ICD-10-CM

## 2019-01-31 DIAGNOSIS — R0982 Postnasal drip: Secondary | ICD-10-CM | POA: Diagnosis not present

## 2019-01-31 DIAGNOSIS — Z113 Encounter for screening for infections with a predominantly sexual mode of transmission: Secondary | ICD-10-CM

## 2019-01-31 LAB — POCT URINE PREGNANCY: Preg Test, Ur: NEGATIVE

## 2019-01-31 MED ORDER — METRONIDAZOLE 500 MG PO TABS: 500.0000 mg | ORAL_TABLET | Freq: Two times a day (BID) | ORAL | 0 refills | Status: DC

## 2019-01-31 MED ORDER — CETIRIZINE-PSEUDOEPHEDRINE ER 5-120 MG PO TB12: 1.0000 | ORAL_TABLET | Freq: Two times a day (BID) | ORAL | 5 refills | Status: DC

## 2019-01-31 NOTE — Progress Notes (Signed)
Subjective:    Patient ID: Kara Castillo, female    DOB: March 15, 1996, 23 y.o.   MRN: [id number]  HPI Kara Castillo is a 23 y.o female that presents to the clinic today with change in vaginal odor and occasional increase in discharge. It began about two weeks ago. She endorses some lower pelvic cramping, pain with sex, and pruritis. She denies any burning, spotting or change in color of the discharge. She has had BV and yeast infections in the past with the most recent being 6 months ago. She has never had an STI before. She does report having a new sexual partner recently but states he does not have any STIs that she is aware of. Her last menstrual period was Feb 19th. She would like a pregnancy test because she feels like she has less energy recently and is sleeping more.  She also reports that she feels like she has been producing thicker mucus and that it is draining down her throat. She feels it tends to get stuck and she is constantly having to cough it up. She denies any nasal congestion, sinus pressure, headaches, sore throat or fever. She has never had seasonal allergies before.    Patient has some rough darker patches of skin on her neck and under her chin. She states it is sometimes itchy.   .. Active Ambulatory Problems    Diagnosis Date Noted  . Maxillary sinusitis, acute 11/08/2013  . Major depressive disorder, recurrent episode, moderate (HCC) 11/17/2016  . Birth control counseling 10/05/2018  . Lower abdominal pain 10/05/2018  . Atypical squamous cells of undetermined significance (ASCUS) on Papanicolaou smear of cervix 10/12/2018   Resolved Ambulatory Problems    Diagnosis Date Noted  . No Resolved Ambulatory Problems   Past Medical History:  Diagnosis Date  . Otitis media      Review of Systems  See HPI    Objective:   Physical Exam Exam conducted with a chaperone present.  Constitutional:      Appearance: Normal appearance.  HENT:     Head: Normocephalic.     Left  Ear: Tympanic membrane normal.     Ears:     Comments: Fluid bubbles behind right TM    Nose: No congestion or rhinorrhea.     Mouth/Throat:     Pharynx: Oropharynx is clear. No posterior oropharyngeal erythema.  Eyes:     Conjunctiva/sclera: Conjunctivae normal.  Cardiovascular:     Rate and Rhythm: Normal rate and regular rhythm.     Heart sounds: Normal heart sounds.  Pulmonary:     Breath sounds: Normal breath sounds.  Genitourinary:    Pubic Area: No rash.      Labia:        Right: No rash or lesion.        Left: No rash or lesion.      Vagina: Vaginal discharge and tenderness present.     Comments: Grey and white discharge present  Skin:    General: Skin is warm.     Capillary Refill: Capillary refill takes less than 2 seconds.     Comments: Scattered macular hyperpigement areas around neck.   Neurological:     General: No focal deficit present.     Mental Status: She is alert.  Psychiatric:        Mood and Affect: Mood normal.           Assessment & Plan:  Marland KitchenMarland KitchenDiagnoses and all orders for this visit:  Discharge from  the vagina -     POCT urine pregnancy -     metroNIDAZOLE (FLAGYL) 500 MG tablet; Take 1 tablet (500 mg total) by mouth 2 (two) times daily. -     SureSwab, Vaginosis/Vaginitis Plus  Screening examination for STD (sexually transmitted disease) -     HIV antibody (with reflex) -     RPR -     SureSwab, Vaginosis/Vaginitis Plus  Eczema, unspecified type  Post-nasal drip -     cetirizine-pseudoephedrine (ZYRTEC-D) 5-120 MG tablet; Take 1 tablet by mouth 2 (two) times daily.  suspect BV based on color of discharge, symptoms and hx of BV. Treated with Metronidazole (flagyl) 500 mg BID for 7 days Will screen for all STI's with surewab.  HIV and RPR in blood.   Suspect PND is allergic more than viral. Start  -Try Zyrtec daily for relief of symptoms - Continue using Flonase twice a day  Places on neck appear like eczema patches. -Mix a small  drop of triamcinolone with a moisturizer and apply to the spots of eczema. Discussed that it can cause hypopigmentation.

## 2019-01-31 NOTE — Progress Notes (Deleted)
d 

## 2019-02-01 LAB — HIV ANTIBODY (ROUTINE TESTING W REFLEX): HIV 1&2 Ab, 4th Generation: NONREACTIVE

## 2019-02-01 LAB — RPR: RPR Ser Ql: NONREACTIVE

## 2019-02-01 NOTE — Progress Notes (Signed)
HIV and RPR negative. Swab will take a few more days to result but will call you when we get results.Marland Kitchen

## 2019-02-02 LAB — SURESWAB, VAGINOSIS/VAGINITIS PLUS
Atopobium vaginae: 6.4 Log (cells/mL)
C. albicans, DNA: NOT DETECTED
C. glabrata, DNA: NOT DETECTED
C. parapsilosis, DNA: NOT DETECTED
C. trachomatis RNA, TMA: NOT DETECTED
C. tropicalis, DNA: NOT DETECTED
LACTOBACILLUS SPECIES: NOT DETECTED Log cells/mL
MEGASPHAERA SPECIES: 7.7 Log (cells/mL)
N. gonorrhoeae RNA, TMA: NOT DETECTED
TRICHOMONAS VAGINALIS RNA: DETECTED — AB

## 2019-02-03 NOTE — Progress Notes (Signed)
Call pt: positive for trichomoniasis and BV. Kara Castillo is a STI and sexual partner needs to be treated. Avoid intercourse until treated. Recheck in 3 months. Metronidazole should treat both that was given.

## 2019-02-23 ENCOUNTER — Other Ambulatory Visit: Payer: Self-pay

## 2019-02-23 DIAGNOSIS — Z3009 Encounter for other general counseling and advice on contraception: Secondary | ICD-10-CM

## 2019-03-14 ENCOUNTER — Encounter: Payer: Self-pay | Admitting: Physician Assistant

## 2019-03-14 ENCOUNTER — Ambulatory Visit (INDEPENDENT_AMBULATORY_CARE_PROVIDER_SITE_OTHER): Payer: 59 | Admitting: Physician Assistant

## 2019-03-14 VITALS — Temp 98.6°F | Wt 270.0 lb

## 2019-03-14 DIAGNOSIS — N898 Other specified noninflammatory disorders of vagina: Secondary | ICD-10-CM

## 2019-03-14 DIAGNOSIS — Z3041 Encounter for surveillance of contraceptive pills: Secondary | ICD-10-CM | POA: Diagnosis not present

## 2019-03-14 MED ORDER — NORETHIN ACE-ETH ESTRAD-FE 1-20 MG-MCG PO TABS: 1.0000 | ORAL_TABLET | Freq: Every day | ORAL | 11 refills | Status: DC

## 2019-03-14 MED ORDER — TINIDAZOLE 500 MG PO TABS: 2.0000 g | ORAL_TABLET | Freq: Every day | ORAL | 0 refills | Status: DC

## 2019-03-14 NOTE — Progress Notes (Signed)
Patient ID: Kara Castillo, female   DOB: [birth year], 23 y.o.   MRN: [id number] .Marland KitchenVirtual Visit via Video Note  I connected with Kara Castillo on 03/15/19 at  1:40 PM EDT by a video enabled telemedicine application and verified that I am speaking with the correct person using two identifiers.   I discussed the limitations of evaluation and management by telemedicine and the availability of in person appointments. The patient expressed understanding and agreed to proceed.  History of Present Illness: Pt is a 23 yo female who c/o vaginal discharge and irritation. She admits she never finished metronidazole from early march due to making her feel "nauseated and itching". She had positive trichomonas and bacterial vaginosis in early march on sureswab. Symptoms did get better but did not resolve and now back stronger. She has had intercourse since but with another partner. Vaginal discharge is thin and clear with some itching. Pt denies any abdominal pain, flank pain, painful sex, urinary symptoms. No fever or chills.   .. Active Ambulatory Problems    Diagnosis Date Noted  . Maxillary sinusitis, acute 11/08/2013  . Major depressive disorder, recurrent episode, moderate (HCC) 11/17/2016  . Birth control counseling 10/05/2018  . Lower abdominal pain 10/05/2018  . Atypical squamous cells of undetermined significance (ASCUS) on Papanicolaou smear of cervix 10/12/2018   Resolved Ambulatory Problems    Diagnosis Date Noted  . No Resolved Ambulatory Problems   Past Medical History:  Diagnosis Date  . Otitis media    Reviewed allergy, problem, med list.    Observations/Objective: No acute distress. Normal HR.  Normal Respirations NOrmal mood.    Today's Vitals   03/14/19 1336  Temp: 98.6 F (37 C)  TempSrc: Oral  Weight: 270 lb (122.5 kg)   Body mass index is 49.38 kg/m.    Assessment and Plan: Marland KitchenMarland KitchenDiagnoses and all orders for this visit:  Vaginal discharge -     tinidazole  (TINDAMAX) 500 MG tablet; Take 4 tablets (2,000 mg total) by mouth daily with breakfast. For 7 days.  Encounter for surveillance of contraceptive pills -     norethindrone-ethinyl estradiol (JUNEL FE,GILDESS FE,LOESTRIN FE) 1-20 MG-MCG tablet; Take 1 tablet by mouth daily.  Vaginal irritation -     tinidazole (TINDAMAX) 500 MG tablet; Take 4 tablets (2,000 mg total) by mouth daily with breakfast. For 7 days.   Since she did not finish metronidazole likely Trich and BV not treated. Sent Tinidazole for 7 days. If still having issues need to have vaginal swab again. Discussed if more discharge in general nuva ring could be effecting this. Pt would like to switch to the pill. Sent Junel.   Pt has had BV symptoms recurrently. Discussed check vaginal pH to keep below 4.5. discussed prevention with Boric acid suppositories. HO will be mailed.   Follow up as needed.    Follow Up Instructions:    I discussed the assessment and treatment plan with the patient. The patient was provided an opportunity to ask questions and all were answered. The patient agreed with the plan and demonstrated an understanding of the instructions.   The patient was advised to call back or seek an in-person evaluation if the symptoms worsen or if the condition fails to improve as anticipated.  I provided 18 minutes of non-face-to-face time during this encounter.   Tandy Gaw, PA-C

## 2019-03-14 NOTE — Progress Notes (Deleted)
-  vaginal itching, same as last time, "very loose liquidly" discharge  -did not finish Metronidazole, super itchy after 3 days, unsure if she is allergic?  -current episode been going for about 2 days  - LMP 02-23-19, last intercourse 03-06-19  -trich and BV positive last time  -tried to restart Metronidazole, got extreme nausea

## 2019-03-28 ENCOUNTER — Encounter: Payer: Self-pay | Admitting: Physician Assistant

## 2019-03-28 ENCOUNTER — Ambulatory Visit (INDEPENDENT_AMBULATORY_CARE_PROVIDER_SITE_OTHER): Payer: 59 | Admitting: Physician Assistant

## 2019-03-28 VITALS — Wt 269.0 lb

## 2019-03-28 DIAGNOSIS — R4586 Emotional lability: Secondary | ICD-10-CM

## 2019-03-28 DIAGNOSIS — F419 Anxiety disorder, unspecified: Secondary | ICD-10-CM | POA: Diagnosis not present

## 2019-03-28 DIAGNOSIS — F339 Major depressive disorder, recurrent, unspecified: Secondary | ICD-10-CM

## 2019-03-28 MED ORDER — LAMOTRIGINE 25 MG PO TABS: ORAL_TABLET | ORAL | 0 refills | Status: DC

## 2019-03-28 NOTE — Progress Notes (Deleted)
-  patient wanted to cancel appt when I called for vitals.Marland Kitchen advised her that it would be considered a no show and she would get charged since it was 9:55.. patient stated she would try to keep appt. She was upset that she was not able to just cancel, I advised her of the cancellation policy with our office  -having depression SX, feels like she may have manic depression?  -having extremes that are "out of character"  -taking sertraline, feels its not working  -please see PHQ/GAD  *POSITIVE FOR THOUGHTS OF HURTING SELF, PER PT NO PLAN TO HURT SELF*

## 2019-03-29 DIAGNOSIS — R4586 Emotional lability: Secondary | ICD-10-CM | POA: Insufficient documentation

## 2019-03-29 DIAGNOSIS — F339 Major depressive disorder, recurrent, unspecified: Secondary | ICD-10-CM | POA: Insufficient documentation

## 2019-03-29 DIAGNOSIS — F419 Anxiety disorder, unspecified: Secondary | ICD-10-CM | POA: Insufficient documentation

## 2019-03-29 NOTE — Progress Notes (Signed)
Patient ID: Kara Castillo, female   DOB: 11/07/1996, 23 y.o.   MRN: [id number] .Marland Kitchen.Virtual Visit via Video Note  I connected with Kara AngerKiesha Ringley on 03/29/19 at 10:10 AM EDT by a video enabled telemedicine application and verified that I am speaking with the correct person using two identifiers.   I discussed the limitations of evaluation and management by telemedicine and the availability of in person appointments. The patient expressed understanding and agreed to proceed.  History of Present Illness: Pt is a 23 yo female with history of anxiety and depression who calls in to discuss worsening depression. Nothing has changed or happened in life to feel this way. Her lift is more "boring" being at home during the COVID 19 pandemic but not bad. She just feels "down, defeated, not happy, not worth it, sad". She wonders if this could be more than just regular depression. She does have thoughts of being better off dead and "this all being over". She has never made a plan. She does admit to extreme mood changes that at times are out of character. She does admit to feeling "better than normal" at times. She is taking zoloft and just doesn't feel like working.   .. Active Ambulatory Problems    Diagnosis Date Noted  . Maxillary sinusitis, acute 11/08/2013  . Major depressive disorder, recurrent episode, moderate (HCC) 11/17/2016  . Birth control counseling 10/05/2018  . Lower abdominal pain 10/05/2018  . Atypical squamous cells of undetermined significance (ASCUS) on Papanicolaou smear of cervix 10/12/2018  . Mood swings 03/29/2019  . Anxiety 03/29/2019  . Depression, recurrent (HCC) 03/29/2019   Resolved Ambulatory Problems    Diagnosis Date Noted  . No Resolved Ambulatory Problems   Past Medical History:  Diagnosis Date  . Otitis media    Reviewed med, allergy, problem list.     Observations/Objective: No acute distress. Flat affect.  Can see tears coming down face while talking to her.    .. Today's Vitals   03/28/19 0958  Weight: 269 lb (122 kg)   Body mass index is 49.2 kg/m. .. [facility] 2[facility] 03/28/2019 01/31/2019 11/28/2018 10/05/2018 12/18/2016  Decreased Interest 3 2 1 1 1   Down, Depressed, Hopeless 3 3 2 1 1   PHQ - 2 Score 6 5 3 2 2   Altered sleeping 3 2 3 1 3   Tired, decreased energy - 3 2 1 1   Change in appetite 2 3 1 1 2   Feeling bad or failure about yourself  0 3 2 0 0  Trouble concentrating 3 - 2 0 3  Moving slowly or fidgety/restless 2 2 1  0 1  Suicidal thoughts 3 2 1  0 0  PHQ-9 Score 19 20 15 5 12   Difficult doing work/chores Extremely dIfficult Very difficult Very difficult Not difficult at all -   .. GAD 7 : Generalized Anxiety Score 03/28/2019 01/31/2019 11/28/2018 12/18/2016  Nervous, Anxious, on Edge 2 3 3 2   Control/stop worrying 3 3 2 1   Worry too much - different things 3 2 2 1   Trouble relaxing 3 3 2 1   Restless 2 1 3 2   Easily annoyed or irritable 3 3 3 2   Afraid - awful might happen 2 3 1  0  Total GAD 7 Score 18 18 16 9   Anxiety Difficulty Very difficult Very difficult Very difficult -      Assessment and Plan: Marland Kitchen.Marland Kitchen.Diagnoses and all orders for this visit:  Depression, recurrent (HCC) -     lamoTRIgine (LAMICTAL) 25  MG tablet; Take one tablet for 2 weeks, then take two tablets for 2 weeks, then take 4 tablets daily. -     Ambulatory referral to Psychiatry  Mood swings -     lamoTRIgine (LAMICTAL) 25 MG tablet; Take one tablet for 2 weeks, then take two tablets for 2 weeks, then take 4 tablets daily. -     Ambulatory referral to Psychiatry  Anxiety -     lamoTRIgine (LAMICTAL) 25 MG tablet; Take one tablet for 2 weeks, then take two tablets for 2 weeks, then take 4 tablets daily. -     Ambulatory referral to Psychiatry   Discussed options with patient. Decided to start lamictal and make referral to mood treatment center. It sounds like she has had some episodes of hypomania/mania and could be a bipolar component. Continue  on zoloft. Discussed how to get help if harmful thoughts worsening or if she made a plan. Follow up in 4 weeks or sooner if needed.   Her BV symptoms have resolved. Discussed ways to prevent BV.    Follow Up Instructions:    I discussed the assessment and treatment plan with the patient. The patient was provided an opportunity to ask questions and all were answered. The patient agreed with the plan and demonstrated an understanding of the instructions.   The patient was advised to call back or seek an in-person evaluation if the symptoms worsen or if the condition fails to improve as anticipated.  I provided 25 minutes of non-face-to-face time during this encounter.   Tandy Gaw, PA-C

## 2019-04-21 ENCOUNTER — Ambulatory Visit (INDEPENDENT_AMBULATORY_CARE_PROVIDER_SITE_OTHER): Payer: 59 | Admitting: Physician Assistant

## 2019-04-21 ENCOUNTER — Encounter: Payer: Self-pay | Admitting: Physician Assistant

## 2019-04-21 VITALS — BP 126/85 | HR 65 | Ht 67.0 in | Wt 266.0 lb

## 2019-04-21 DIAGNOSIS — N898 Other specified noninflammatory disorders of vagina: Secondary | ICD-10-CM | POA: Diagnosis not present

## 2019-04-21 DIAGNOSIS — F331 Major depressive disorder, recurrent, moderate: Secondary | ICD-10-CM

## 2019-04-21 DIAGNOSIS — R4586 Emotional lability: Secondary | ICD-10-CM

## 2019-04-21 LAB — WET PREP FOR TRICH, YEAST, CLUE
MICRO NUMBER:: 500097
Specimen Quality: ADEQUATE

## 2019-04-21 MED ORDER — LAMOTRIGINE 100 MG PO TABS: 100.0000 mg | ORAL_TABLET | Freq: Every day | ORAL | 0 refills | Status: DC

## 2019-04-21 MED ORDER — FLUCONAZOLE 150 MG PO TABS: 150.0000 mg | ORAL_TABLET | Freq: Once | ORAL | 0 refills | Status: AC

## 2019-04-21 MED ORDER — METRONIDAZOLE 0.75 % VA GEL: VAGINAL | 1 refills | Status: DC

## 2019-04-21 MED ORDER — TINIDAZOLE 500 MG PO TABS: 2.0000 g | ORAL_TABLET | Freq: Every day | ORAL | 0 refills | Status: DC

## 2019-04-21 NOTE — Addendum Note (Signed)
Addended by: Jomarie Longs on: 04/21/2019 02:24 PM   Modules accepted: Orders

## 2019-04-21 NOTE — Progress Notes (Signed)
Subjective:    Patient ID: Kara Castillo, female    DOB: 09-20-1996, 23 y.o.   MRN: [id number]  HPI  Pt is a 23 yo female with hx of trichomonas and bacterial vaginosis who comes into the clinic with vaginal irritation. She has not had any sexual activity since last trichomonas treatment on 4/14/ She recently finished menstrual cycle and then started having vaginal irritation. Has not noticed any odor or discharge. No fever, chills, abdominal pain.   She is doing well with mood. She feels much more stable. She likes the lamictal.   .. Active Ambulatory Problems    Diagnosis Date Noted  . Major depressive disorder, recurrent episode, moderate (HCC) 11/17/2016  . Birth control counseling 10/05/2018  . Lower abdominal pain 10/05/2018  . Atypical squamous cells of undetermined significance (ASCUS) on Papanicolaou smear of cervix 10/12/2018  . Mood swings 03/29/2019  . Anxiety 03/29/2019  . Depression, recurrent (HCC) 03/29/2019   Resolved Ambulatory Problems    Diagnosis Date Noted  . Maxillary sinusitis, acute 11/08/2013   Past Medical History:  Diagnosis Date  . Otitis media        Review of Systems See HPI.     Objective:   Physical Exam Vitals signs reviewed.  Constitutional:      Appearance: Normal appearance. She is obese.  Cardiovascular:     Rate and Rhythm: Normal rate.  Pulmonary:     Effort: Pulmonary effort is normal.     Breath sounds: Normal breath sounds.  Abdominal:     General: There is no distension.     Palpations: There is no mass.     Tenderness: There is no abdominal tenderness. There is no guarding or rebound.  Genitourinary:    General: Normal vulva.     Vagina: Vaginal discharge present.     Rectum: Normal.     Comments: External vulva normal in appearance. There is thin, white to grey discharge in vaginal canal. No pain with bimanuel exam.  Neurological:     General: No focal deficit present.     Mental Status: She is alert and oriented  to person, place, and time.  Psychiatric:        Mood and Affect: Mood normal.        Behavior: Behavior normal.           Assessment & Plan:  Marland KitchenMarland KitchenEmberlin was seen today for vaginal itching.  Diagnoses and all orders for this visit:  Vaginal irritation -     tinidazole (TINDAMAX) 500 MG tablet; Take 4 tablets (2,000 mg total) by mouth daily with breakfast. For 7 days. -     metroNIDAZOLE (METROGEL) 0.75 % vaginal gel; Use twice weekly for 4-6 months. -     WET PREP FOR TRICH, YEAST, CLUE  Vaginal discharge -     tinidazole (TINDAMAX) 500 MG tablet; Take 4 tablets (2,000 mg total) by mouth daily with breakfast. For 7 days. -     metroNIDAZOLE (METROGEL) 0.75 % vaginal gel; Use twice weekly for 4-6 months. -     WET PREP FOR TRICH, YEAST, CLUE  Major depressive disorder, recurrent episode, moderate (HCC) -     lamoTRIgine (LAMICTAL) 100 MG tablet; Take 1 tablet (100 mg total) by mouth daily.  Mood swings -     lamoTRIgine (LAMICTAL) 100 MG tablet; Take 1 tablet (100 mg total) by mouth daily.  pt does not need STI testing she has not been sexually active since last testing. Will check  for trich clearance and appearance of BV with wet prep. Suspect BV. Treated again today with metronidazole gel for 4 months twice a week for prevention due to recurrence. HO given. Follow up as needed.   Refilled lamictal with zoloft. Pt symptoms improving.

## 2019-04-21 NOTE — Progress Notes (Signed)
Yeast on wet prep no BV or trich. Sent diflucan. You can hold on starting bacterial vaginosis.

## 2019-04-21 NOTE — Patient Instructions (Signed)

## 2019-05-11 ENCOUNTER — Other Ambulatory Visit: Payer: Self-pay | Admitting: Physician Assistant

## 2019-05-11 DIAGNOSIS — Z3041 Encounter for surveillance of contraceptive pills: Secondary | ICD-10-CM

## 2019-05-24 ENCOUNTER — Encounter: Payer: Self-pay | Admitting: Physician Assistant

## 2019-05-24 ENCOUNTER — Ambulatory Visit (INDEPENDENT_AMBULATORY_CARE_PROVIDER_SITE_OTHER): Payer: 59 | Admitting: Physician Assistant

## 2019-05-24 VITALS — BP 117/62 | HR 67 | Temp 98.6°F | Ht 67.0 in | Wt 263.0 lb

## 2019-05-24 DIAGNOSIS — F331 Major depressive disorder, recurrent, moderate: Secondary | ICD-10-CM | POA: Diagnosis not present

## 2019-05-24 DIAGNOSIS — L259 Unspecified contact dermatitis, unspecified cause: Secondary | ICD-10-CM | POA: Diagnosis not present

## 2019-05-24 DIAGNOSIS — Z113 Encounter for screening for infections with a predominantly sexual mode of transmission: Secondary | ICD-10-CM | POA: Diagnosis not present

## 2019-05-24 DIAGNOSIS — L680 Hirsutism: Secondary | ICD-10-CM | POA: Diagnosis not present

## 2019-05-24 MED ORDER — SPIRONOLACTONE 50 MG PO TABS: 50.0000 mg | ORAL_TABLET | Freq: Every day | ORAL | 5 refills | Status: DC

## 2019-05-24 MED ORDER — SERTRALINE HCL 50 MG PO TABS: ORAL_TABLET | ORAL | 1 refills | Status: DC

## 2019-05-24 MED ORDER — TRIAMCINOLONE ACETONIDE 0.1 % EX CREA: 1.0000 "application " | TOPICAL_CREAM | Freq: Two times a day (BID) | CUTANEOUS | 1 refills | Status: DC

## 2019-05-24 NOTE — Patient Instructions (Signed)
Polycystic Ovarian Syndrome  Polycystic ovarian syndrome (PCOS) is a common hormonal disorder among women of reproductive age. In most women with PCOS, many small fluid-filled sacs (cysts) grow on the ovaries, and the cysts are not part of a normal menstrual cycle. PCOS can cause problems with your menstrual periods and make it difficult to get pregnant. It can also cause an increased risk of miscarriage with pregnancy. If it is not treated, PCOS can lead to serious health problems, such as diabetes and heart disease. What are the causes? The cause of PCOS is not known, but it may be the result of a combination of certain factors, such as:  Irregular menstrual cycle.  High levels of certain hormones (androgens).  Problems with the hormone that helps to control blood sugar (insulin resistance).  Certain genes. What increases the risk? This condition is more likely to develop in women who have a family history of PCOS. What are the signs or symptoms? Symptoms of PCOS may include:  Multiple ovarian cysts.  Infrequent periods or no periods.  Periods that are too frequent or too heavy.  Unpredictable periods.  Inability to get pregnant (infertility) because of not ovulating.  Increased growth of hair on the face, chest, stomach, back, thumbs, thighs, or toes.  Acne or oily skin. Acne may develop during adulthood, and it may not respond to treatment.  Pelvic pain.  Weight gain or obesity.  Patches of thickened and dark [place] or black skin on the neck, arms, breasts, or thighs (acanthosis nigricans).  Excess hair growth on the face, chest, abdomen, or upper thighs (hirsutism). How is this diagnosed? This condition is diagnosed based on:  Your medical history.  A physical exam, including a pelvic exam. Your health care provider may look for areas of increased hair growth on your skin.  Tests, such as: ? Ultrasound. This may be used to examine the ovaries and the lining of the  uterus (endometrium) for cysts. ? Blood tests. These may be used to check levels of sugar (glucose), female hormone (testosterone), and female hormones (estrogen and progesterone) in your blood. How is this treated? There is no cure for PCOS, but treatment can help to manage symptoms and prevent more health problems from developing. Treatment varies depending on:  Your symptoms.  Whether you want to have a baby or whether you need birth control (contraception). Treatment may include nutrition and lifestyle changes along with:  Progesterone hormone to start a menstrual period.  Birth control pills to help you have regular menstrual periods.  Medicines to make you ovulate, if you want to get pregnant.  Medicine to reduce excessive hair growth.  Surgery, in severe cases. This may involve making small holes in one or both of your ovaries. This decreases the amount of testosterone that your body produces. Follow these instructions at home:  Take over-the-counter and prescription medicines only as told by your health care provider.  Follow a healthy meal plan. This can help you reduce the effects of PCOS. ? Eat a healthy diet that includes lean proteins, complex carbohydrates, fresh fruits and vegetables, low-fat dairy products, and healthy fats. Make sure to eat enough fiber.  If you are overweight, lose weight as told by your health care provider. ? Losing 10% of your body weight may improve symptoms. ? Your health care provider can determine how much weight loss is best for you and can help you lose weight safely.  Keep all follow-up visits as told by your health care provider.   This is important. Contact a health care provider if:  Your symptoms do not get better with medicine.  You develop new symptoms. This information is not intended to replace advice given to you by your health care provider. Make sure you discuss any questions you have with your health care provider. Document  Released: 03/12/2005 Document Revised: 07/14/2016 Document Reviewed: 05/03/2016 Elsevier Interactive Patient Education  2019 [employer]. Hirsutism and Masculinization Hirsutism is when a female has an excessive amount of hair in an area where a female would typically have hair growth, such as the face, chest, or back. Masculinization is when a female's body develops like a female's body. This may include a deepening voice, loss of breast tissue, increased muscle mass, thinning scalp hair (balding), and clitoris growth. Many things can cause these conditions. The most common cause is polycystic ovary syndrome (PCOS). Your health care provider may do blood tests and imaging studies to find the cause. Follow these instructions at home:  Take over-the-counter and prescription medicines only as told by your health care provider.  Lose weight if you are overweight, and maintain a healthy weight.  Ask your health care provider for recommendations for hair removal. These may include temporary measures like shaving, creams, and waxing. More permanent ways to remove hair include electrolysis and laser treatments.  Keep all follow-up visits as told by your health care provider. This is important. Contact a health care provider if:  Your symptoms suddenly get worse.  You develop acne.  You have irregular menstrual periods.  You stop having your period.  You feel a lump in your lower abdomen. This information is not intended to replace advice given to you by your health care provider. Make sure you discuss any questions you have with your health care provider. Document Released: 01/25/2002 Document Revised: 04/23/2016 Document Reviewed: 08/06/2015 Elsevier Interactive Patient Education  2019 [employer].

## 2019-05-24 NOTE — Progress Notes (Signed)
Subjective:    Patient ID: Kara Castillo, female    DOB: February 26, 1996, 23 y.o.   MRN: [id number]  HPI  Pt is a 23 yo female with hx of recurrent BV and STI who presents to the clinic for follow up.   She is using the metronidazole twice a week for prevention. She denies any vaginal discharge or odor. She did have a new sexual partner and would like to be tested.   She feels like depression is pretty controlled on zoloft. lamictal was making her feel too "numb". She needs refill of zoloft.   She does mention more hair growth on her chin. She wonders about why and what to do.   .. Active Ambulatory Problems    Diagnosis Date Noted  . Major depressive disorder, recurrent episode, moderate ([place]) 11/17/2016  . Birth control counseling 10/05/2018  . Lower abdominal pain 10/05/2018  . Atypical squamous cells of undetermined significance (ASCUS) on Papanicolaou smear of cervix 10/12/2018  . Mood swings 03/29/2019  . Anxiety 03/29/2019  . Depression, recurrent ([place]) 03/29/2019  . Screening examination for STD (sexually transmitted disease) 05/24/2019   Resolved Ambulatory Problems    Diagnosis Date Noted  . Maxillary sinusitis, acute 11/08/2013   Past Medical History:  Diagnosis Date  . Otitis media      Review of Systems    see HPI.  Objective:   Physical Exam Vitals signs reviewed.  Constitutional:      Appearance: Normal appearance. She is obese.  HENT:     Head: Normocephalic.  Cardiovascular:     Rate and Rhythm: Normal rate and regular rhythm.  Pulmonary:     Effort: Pulmonary effort is normal.     Breath sounds: Normal breath sounds.  Abdominal:     General: Bowel sounds are normal. There is no distension.     Palpations: Abdomen is soft. There is no mass.     Tenderness: There is no abdominal tenderness. There is no guarding or rebound.  Genitourinary:    General: Normal vulva.  Neurological:     General: No focal deficit present.     Mental Status: She is  alert and oriented to person, place, and time.  Psychiatric:        Mood and Affect: Mood normal.           Assessment & Plan:  Marland KitchenMarland KitchenYsabel was seen today for follow-up.  Diagnoses and all orders for this visit:  Screening examination for STD (sexually transmitted disease) -     WET PREP FOR [place], YEAST, CLUE -     C. trachomatis/N. gonorrhoeae RNA  Contact dermatitis and eczema -     triamcinolone cream (KENALOG) 0.1 %; Apply 1 application topically 2 (two) times daily.  Major depressive disorder, recurrent episode, moderate (HCC) -     sertraline (ZOLOFT) 50 MG tablet; Take one and one-half tablet daily.  Hirsutism -     spironolactone (ALDACTONE) 50 MG tablet; Take 1 tablet (50 mg total) by mouth daily.   Screened for STD due to new sexual partner. No symptoms.  Wet prep added as well. Continue treatment for recurrent BV for at least 4 months.   Refilled. Zoloft. Follow up as needed.   Started spironolactone for hirsuitism. Offered to screen for PCOS. Pt declined labs today. Offered metformin for insulin resistance. Pt declined.   .. Depression screen [facility] 2/9 05/29/2019 03/28/2019 01/31/2019 11/28/2018 10/05/2018  Decreased Interest 1 3 2 1 1   Down, Depressed, Hopeless 1 3  3 2 1   PHQ - 2 Score 2 6 5 3 2   Altered sleeping 1 3 2 3 1   Tired, decreased energy 1 - 3 2 1   Change in appetite 1 2 3 1 1   Feeling bad or failure about yourself  1 0 3 2 0  Trouble concentrating 0 3 - 2 0  Moving slowly or fidgety/restless 1 2 2 1  0  Suicidal thoughts 0 3 2 1  0  PHQ-9 Score 7 19 20 15 5   Difficult doing work/chores Somewhat difficult Extremely dIfficult Very difficult Very difficult Not difficult at all

## 2019-05-25 NOTE — Progress Notes (Signed)
Call pt: negative for trich, yeast and clue cells. Great news.

## 2019-05-26 LAB — WET PREP FOR TRICH, YEAST, CLUE
MICRO NUMBER:: 602927
Specimen Quality: ADEQUATE

## 2019-05-26 LAB — C. TRACHOMATIS/N. GONORRHOEAE RNA
C. trachomatis RNA, TMA: NOT DETECTED
N. gonorrhoeae RNA, TMA: NOT DETECTED

## 2019-05-26 NOTE — Progress Notes (Signed)
No GC/Chlamydia

## 2019-05-29 ENCOUNTER — Encounter: Payer: Self-pay | Admitting: Physician Assistant

## 2019-05-29 DIAGNOSIS — L680 Hirsutism: Secondary | ICD-10-CM | POA: Insufficient documentation

## 2019-06-05 ENCOUNTER — Ambulatory Visit: Payer: 59 | Admitting: Physician Assistant

## 2019-06-08 ENCOUNTER — Telehealth: Payer: 59 | Admitting: Family Medicine

## 2019-06-08 NOTE — Progress Notes (Signed)
  NO show.  Called cell phone no answer went to voicemail.  Called home phone number listed as well.

## 2019-06-08 NOTE — Progress Notes (Signed)
I have attempted 2x to call pt to obtain her VS and go over her medications. I left vm both times advising her of this.Maryruth Eve, Lahoma Crocker, CMA

## 2019-07-07 ENCOUNTER — Other Ambulatory Visit (HOSPITAL_COMMUNITY)
Admission: RE | Admit: 2019-07-07 | Discharge: 2019-07-07 | Disposition: A | Discharge status: Home / Self Care | Payer: 59 | Source: Ambulatory Visit | Attending: Physician Assistant | Admitting: Physician Assistant

## 2019-07-07 ENCOUNTER — Other Ambulatory Visit: Payer: Self-pay

## 2019-07-07 ENCOUNTER — Encounter: Payer: Self-pay | Admitting: Physician Assistant

## 2019-07-07 ENCOUNTER — Ambulatory Visit (INDEPENDENT_AMBULATORY_CARE_PROVIDER_SITE_OTHER): Payer: 59 | Admitting: Physician Assistant

## 2019-07-07 VITALS — BP 118/61 | HR 68 | Ht 67.0 in | Wt 253.0 lb

## 2019-07-07 DIAGNOSIS — Z113 Encounter for screening for infections with a predominantly sexual mode of transmission: Secondary | ICD-10-CM

## 2019-07-07 DIAGNOSIS — N898 Other specified noninflammatory disorders of vagina: Secondary | ICD-10-CM | POA: Diagnosis not present

## 2019-07-07 DIAGNOSIS — Z0000 Encounter for general adult medical examination without abnormal findings: Secondary | ICD-10-CM | POA: Diagnosis present

## 2019-07-07 DIAGNOSIS — Z124 Encounter for screening for malignant neoplasm of cervix: Secondary | ICD-10-CM | POA: Diagnosis not present

## 2019-07-07 LAB — WET PREP FOR TRICH, YEAST, CLUE
MICRO NUMBER:: 748509
Specimen Quality: ADEQUATE

## 2019-07-07 MED ORDER — METRONIDAZOLE 500 MG PO TABS: 500.0000 mg | ORAL_TABLET | Freq: Two times a day (BID) | ORAL | 0 refills | Status: DC

## 2019-07-07 NOTE — Progress Notes (Signed)
Subjective:    Patient ID: Kara Castillo, female    DOB: 1996-08-04, 23 y.o.   MRN: [id number]  HPI  Pt is a 23 yo female with a history of recurrent BV who presents to the clinic with vaginal discharge that started 3-4 days ago. She describes it as green to white. No itching, pain or irritation. It did have a bad odor to it. She is sexually active with 2-3 partners. She is using condoms and OCP. No abdominal pain. She also is due for a pap. She tried her metronidazole gel. It did seem to help.   .. Active Ambulatory Problems    Diagnosis Date Noted  . Major depressive disorder, recurrent episode, moderate ([place]) 11/17/2016  . Birth control counseling 10/05/2018  . Lower abdominal pain 10/05/2018  . Atypical squamous cells of undetermined significance (ASCUS) on Papanicolaou smear of cervix 10/12/2018  . Mood swings 03/29/2019  . Anxiety 03/29/2019  . Depression, recurrent ([place]) 03/29/2019  . Screening examination for STD (sexually transmitted disease) 05/24/2019  . Hirsutism 05/29/2019   Resolved Ambulatory Problems    Diagnosis Date Noted  . Maxillary sinusitis, acute 11/08/2013   Past Medical History:  Diagnosis Date  . Otitis media         Review of Systems See HPI.     Objective:   Physical Exam Vitals signs reviewed.  Constitutional:      Appearance: Normal appearance.  HENT:     Head: Normocephalic.     Right Ear: Tympanic membrane normal.     Left Ear: Tympanic membrane normal.  Cardiovascular:     Rate and Rhythm: Normal rate and regular rhythm.     Pulses: Normal pulses.  Pulmonary:     Effort: Pulmonary effort is normal.     Breath sounds: Normal breath sounds.  Abdominal:     General: Abdomen is flat. Bowel sounds are normal.     Palpations: Abdomen is soft.     Tenderness: There is no abdominal tenderness.  Genitourinary:    Comments: Active green discharge coming out of cervical os and present on external labia. Cervix is very friable around os.   Very mild adnexal tenderness.  Neurological:     General: No focal deficit present.     Mental Status: She is alert and oriented to person, place, and time.  Psychiatric:        Mood and Affect: Mood normal.           Assessment & Plan:  Marland KitchenMarland KitchenRosangela was seen today for annual exam.  Diagnoses and all orders for this visit:  Routine Papanicolaou smear -     Cytology - PAP -     WET PREP FOR [place], YEAST, CLUE  Screening examination for STD (sexually transmitted disease) -     WET PREP FOR [place], YEAST, CLUE  Vaginal discharge -     metroNIDAZOLE (FLAGYL) 500 MG tablet; Take 1 tablet (500 mg total) by mouth 2 (two) times daily. For 7 days. -     azithromycin (ZITHROMAX) 500 MG tablet; Take 2 tablets(1g) as a single dose.  .. Results for orders placed or performed in visit on 07/07/19  WET PREP FOR [place], YEAST, CLUE  Result Value Ref Range   MICRO NUMBER: [id number]    Specimen Quality Adequate    SOURCE: NOT GIVEN    Status FINAL    RESULT      No Trichomonas vaginalis seen. No yeast seen No clue cells seen Epithelial Cells Present  Treated for BV with metronidazole before wet prep came back negative. Sent azithromycin and nurse will call to come in for rocephin 250mg  IM since negative wet prep to treat for GC/Chlamydia.   Pap done with STD testing.  Continue to use condoms.

## 2019-07-07 NOTE — Patient Instructions (Signed)

## 2019-07-08 MED ORDER — AZITHROMYCIN 500 MG PO TABS: ORAL_TABLET | ORAL | 0 refills | Status: DC

## 2019-07-08 NOTE — Progress Notes (Signed)
Kara Castillo,  No BV/yeast/trich seen. I am going to empirically treat for GC/chlamydia even if pap testing is not back yet. Schedule nurse visit on Monday  for 250mg  IM dose of ceftriaxone and I will send azithromycin 1g as a single dose. No sexual interaction for 7 days. I would come back in 1 month to check for clearance. Do not officially alert partners until we get confirmation of results with testing.

## 2019-07-11 ENCOUNTER — Ambulatory Visit: Payer: 59

## 2019-07-11 ENCOUNTER — Other Ambulatory Visit: Payer: Self-pay

## 2019-07-11 ENCOUNTER — Ambulatory Visit (INDEPENDENT_AMBULATORY_CARE_PROVIDER_SITE_OTHER): Payer: 59 | Admitting: Physician Assistant

## 2019-07-11 ENCOUNTER — Telehealth: Payer: Self-pay | Admitting: Physician Assistant

## 2019-07-11 VITALS — BP 120/70 | HR 77 | Temp 98.9°F | Ht 67.0 in | Wt 258.0 lb

## 2019-07-11 DIAGNOSIS — N898 Other specified noninflammatory disorders of vagina: Secondary | ICD-10-CM

## 2019-07-11 DIAGNOSIS — R1030 Lower abdominal pain, unspecified: Secondary | ICD-10-CM

## 2019-07-11 LAB — CYTOLOGY - PAP
Chlamydia: NEGATIVE
Diagnosis: NEGATIVE
Neisseria Gonorrhea: NEGATIVE

## 2019-07-11 MED ORDER — CEFTRIAXONE SODIUM 1 G IJ SOLR: 250.0000 mg | Freq: Once | INTRAMUSCULAR | Status: DC

## 2019-07-11 MED ORDER — CEFTRIAXONE SODIUM 250 MG IJ SOLR
250.0000 mg | Freq: Once | INTRAMUSCULAR | Status: AC
  Administered 2019-07-11: 250 mg via INTRAMUSCULAR

## 2019-07-11 NOTE — Telephone Encounter (Signed)
Pap smear is negative No evidence of gonorrhea or chlamydia infection

## 2019-07-11 NOTE — Progress Notes (Signed)
Patient presents to office today for injection of 250 mg Ceftriaxone per Jade's recommendation. Patient took her dose of Azithromycin on Sunday. Reports that discharge is improving and currently there is no odor. States she still has so abdominal discomfort.  Patient's chart notes that she has a penicillin allergy. Per patient, she is unsure if she is actually allergic, or if it is her mother that is allergic. Patient reports that if it is truly herself that is allergic "at most it would just cause hives". I have discussed treatment plan and possible interaction with Nelson Chimes, PA-C who is covering for [facility] today.   Patient tolerated injection well in [place] without any complications. Patient was monitored for 10 minutes post injection and denied any reactions. Patient advised to contact us in case of any reactions or needs.

## 2019-07-12 NOTE — Telephone Encounter (Signed)
LM @ 2:03pm that pap smear was negative. WB

## 2019-07-12 NOTE — Progress Notes (Signed)
Pap is normal. No abnormal cells. Negative for chlamydia and gonorrhea. Despite wet prep being negative. Likely BV. At time wet prep is not as sensitive.

## 2019-07-19 ENCOUNTER — Encounter: Payer: Self-pay | Admitting: Physician Assistant

## 2019-07-28 ENCOUNTER — Ambulatory Visit: Payer: 59 | Admitting: Physician Assistant

## 2019-09-14 ENCOUNTER — Other Ambulatory Visit: Payer: Self-pay

## 2019-09-14 ENCOUNTER — Ambulatory Visit (INDEPENDENT_AMBULATORY_CARE_PROVIDER_SITE_OTHER): Payer: 59 | Admitting: Family Medicine

## 2019-09-14 ENCOUNTER — Encounter: Payer: Self-pay | Admitting: Family Medicine

## 2019-09-14 VITALS — BP 116/59 | HR 82 | Ht 67.0 in | Wt 230.0 lb

## 2019-09-14 DIAGNOSIS — Z23 Encounter for immunization: Secondary | ICD-10-CM | POA: Diagnosis not present

## 2019-09-14 DIAGNOSIS — N898 Other specified noninflammatory disorders of vagina: Secondary | ICD-10-CM

## 2019-09-14 DIAGNOSIS — Z833 Family history of diabetes mellitus: Secondary | ICD-10-CM | POA: Diagnosis not present

## 2019-09-14 DIAGNOSIS — R809 Proteinuria, unspecified: Secondary | ICD-10-CM

## 2019-09-14 LAB — WET PREP FOR TRICH, YEAST, CLUE
MICRO NUMBER:: 993679
Specimen Quality: ADEQUATE

## 2019-09-14 MED ORDER — METRONIDAZOLE 500 MG PO TABS: 500.0000 mg | ORAL_TABLET | Freq: Two times a day (BID) | ORAL | 0 refills | Status: DC

## 2019-09-14 NOTE — Progress Notes (Signed)
Acute Office Visit  Subjective:    Patient ID: Kara Castillo, female    DOB: 14-May-1996, 23 y.o.   MRN: [id number]  Chief Complaint  Patient presents with  . Vaginal Discharge    x 2 days. denies any odor,clear in color,not itchy    HPI Patient is in today for vaginal discharge that I watery and clear.  She says that it Simas discharge that she initially thought she might be urinating on herself.  But it does not have an odor.  She denies any pelvic pain.  She denies any dysuria.  He has had she is sexually active. Wears condoms. She is on Nuvaring.  Not itching and no odor.  She has had some tenderness about the vaginal introutus. She has noticed a couple of bumps in the area. Wonders if is just irritation from sex.    Past Medical History:  Diagnosis Date  . Otitis media     Past Surgical History:  Procedure Laterality Date  . TYMPANOSTOMY TUBE PLACEMENT      Family History  Problem Relation Age of Onset  . Hypertension Maternal Grandmother     Social History   Socioeconomic History  . Marital status: Single    Spouse name: Not on file  . Number of children: Not on file  . Years of education: Not on file  . Highest education level: Not on file  Occupational History  . Not on file  Social Needs  . Financial resource strain: Not on file  . Food insecurity    Worry: Not on file    Inability: Not on file  . Transportation needs    Medical: Not on file    Non-medical: Not on file  Tobacco Use  . Smoking status: Never Smoker  . Smokeless tobacco: Never Used  Substance and Sexual Activity  . Alcohol use: Not on file  . Drug use: Not on file  . Sexual activity: Not on file  Lifestyle  . Physical activity    Days per week: Not on file    Minutes per session: Not on file  . Stress: Not on file  Relationships  . Social Herbalist on phone: Not on file    Gets together: Not on file    Attends religious service: Not on file    Active member of club or  organization: Not on file    Attends meetings of clubs or organizations: Not on file    Relationship status: Not on file  . Intimate partner violence    Fear of current or ex partner: Not on file    Emotionally abused: Not on file    Physically abused: Not on file    Forced sexual activity: Not on file  Other Topics Concern  . Not on file  Social History Narrative  . Not on file    Outpatient Medications Prior to Visit  Medication Sig Dispense Refill  . cetirizine-pseudoephedrine (ZYRTEC-D) 5-120 MG tablet Take 1 tablet by mouth 2 (two) times daily. 60 tablet 5  . fluticasone (FLONASE) 50 MCG/ACT nasal spray Place 2 sprays into both nostrils daily. 16 g 6  . metroNIDAZOLE (FLAGYL) 500 MG tablet Take 1 tablet (500 mg total) by mouth 2 (two) times daily. For 7 days. 14 tablet 0  . norethindrone-ethinyl estradiol (JUNEL FE,GILDESS FE,LOESTRIN FE) 1-20 MG-MCG tablet Take 1 tablet by mouth daily. 1 Package 11  . sertraline (ZOLOFT) 50 MG tablet Take one and one-half tablet daily.  135 tablet 1  . spironolactone (ALDACTONE) 50 MG tablet Take 1 tablet (50 mg total) by mouth daily. 30 tablet 5  . triamcinolone cream (KENALOG) 0.1 % Apply 1 application topically 2 (two) times daily. 30 g 1  . sulfamethoxazole-trimethoprim (BACTRIM DS) 800-160 MG tablet Take by mouth.     No facility-administered medications prior to visit.     Allergies  Allergen Reactions  . Penicillins Other (See Comments)    Unknown. Childhood    ROS     Objective:    Physical Exam  Constitutional: She is oriented to person, place, and time. She appears well-developed and well-nourished.  HENT:  Head: Normocephalic and atraumatic.  Eyes: Conjunctivae and EOM are normal.  Cardiovascular: Normal rate.  Pulmonary/Chest: Effort normal.  Genitourinary:       Genitourinary Comments: She has some pink sores at the introitus.  There are white ulceration at the base but there is some pink skin.  Gentle discharge is  copious but clear.   Neurological: She is alert and oriented to person, place, and time.  Skin: Skin is dry. No pallor.  Psychiatric: She has a normal mood and affect. Her behavior is normal.  Vitals reviewed.   BP (!) 116/59   Pulse 82   Ht 5' 7"  (1.702 m)   Wt 230 lb (104.3 kg)   LMP 09/09/2019 (Exact Date)   SpO2 99%   BMI 36.02 kg/m  Wt Readings from Last 3 Encounters:  09/14/19 230 lb (104.3 kg)  07/11/19 258 lb (117 kg)  07/07/19 253 lb (114.8 kg)    There are no preventive care reminders to display for this patient.  There are no preventive care reminders to display for this patient.   No results found for: TSH No results found for: WBC, HGB, HCT, MCV, PLT No results found for: NA, K, CHLORIDE, CO2, GLUCOSE, BUN, CREATININE, BILITOT, ALKPHOS, AST, ALT, PROT, ALBUMIN, CALCIUM, ANIONGAP, EGFR, GFR  No results found for: CHOL No results found for: HDL No results found for: LDLCALC No results found for: TRIG No results found for: CHOLHDL Lab Results  Component Value Date   HGBA1C 5.2 09/15/2019       Assessment & Plan:   Problem List Items Addressed This Visit    None    Visit Diagnoses    Vaginal discharge    -  Primary   Relevant Orders   POCT urinalysis dipstick (Completed)   WET PREP FOR [place], [place] (Completed)   C. trachomatis/N. gonorrhoeae RNA   Need for immunization against influenza       Relevant Orders   Flu Vaccine QUAD 36+ mos IM (Completed)   Family history of diabetes mellitus       Relevant Orders   POCT glycosylated hemoglobin (Hb A1C) (Completed)   Microalbuminuria         Vaginal discharge-wet prep came back positive for clue cells.  Will treat with metronidazole.  Call if not better in 1 week.  Urinalysis was negative for UTI.  She did have some protein present so we also discussed getting a repeat urinalysis in 1 week to make sure that everything has cleared.  Skin lesions - She also had some lesions right at the introitus.   Unclear if it is just irritation of the skin or if it might actually be herpes simplex.  I did not see any vesicles or blisters.  Unfortunately we did not have the right culture tube to do a sample today but  if these do not resolve or return we would be happy to see her back and swab them.  Family history of diabetes - A1C is normal today.   Microalbuminuria - She did have some protein present so we also discussed getting a repeat urinalysis in 1 week to make sure that everything has cleared.   Meds ordered this encounter  Medications  . metroNIDAZOLE (FLAGYL) 500 MG tablet    Sig: Take 1 tablet (500 mg total) by mouth 2 (two) times daily.    Dispense:  14 tablet    Refill:  0     Beatrice Lecher, MD

## 2019-09-15 LAB — POCT GLYCOSYLATED HEMOGLOBIN (HGB A1C): Hemoglobin A1C: 5.2 % (ref 4.0–5.6)

## 2019-09-15 LAB — POCT URINALYSIS DIPSTICK
Bilirubin, UA: NEGATIVE
Glucose, UA: NEGATIVE
Ketones, UA: NEGATIVE
Leukocytes, UA: NEGATIVE
Nitrite, UA: NEGATIVE
Protein, UA: POSITIVE — AB
Spec Grav, UA: 1.025 (ref 1.010–1.025)
Urobilinogen, UA: 0.2 E.U./dL
pH, UA: 6 (ref 5.0–8.0)

## 2019-09-16 LAB — C. TRACHOMATIS/N. GONORRHOEAE RNA
C. trachomatis RNA, TMA: NOT DETECTED
N. gonorrhoeae RNA, TMA: NOT DETECTED

## 2019-09-17 ENCOUNTER — Other Ambulatory Visit: Payer: Self-pay

## 2019-09-17 ENCOUNTER — Encounter (HOSPITAL_COMMUNITY): Payer: Self-pay | Admitting: Emergency Medicine

## 2019-09-17 ENCOUNTER — Emergency Department (HOSPITAL_COMMUNITY)
Admission: EM | Admit: 2019-09-17 | Discharge: 2019-09-17 | Disposition: A | Discharge status: Home / Self Care | Payer: 59 | Attending: Emergency Medicine | Admitting: Emergency Medicine

## 2019-09-17 DIAGNOSIS — B9689 Other specified bacterial agents as the cause of diseases classified elsewhere: Secondary | ICD-10-CM

## 2019-09-17 DIAGNOSIS — N898 Other specified noninflammatory disorders of vagina: Secondary | ICD-10-CM | POA: Diagnosis present

## 2019-09-17 DIAGNOSIS — N760 Acute vaginitis: Secondary | ICD-10-CM | POA: Diagnosis not present

## 2019-09-17 DIAGNOSIS — Z793 Long term (current) use of hormonal contraceptives: Secondary | ICD-10-CM | POA: Insufficient documentation

## 2019-09-17 DIAGNOSIS — Z79899 Other long term (current) drug therapy: Secondary | ICD-10-CM | POA: Diagnosis not present

## 2019-09-17 HISTORY — DX: Acute vaginitis: B96.89

## 2019-09-17 HISTORY — DX: Other specified bacterial agents as the cause of diseases classified elsewhere: N76.0

## 2019-09-17 LAB — COMPREHENSIVE METABOLIC PANEL
ALT: 10 U/L (ref 0–44)
AST: 25 U/L (ref 15–41)
Albumin: 3.8 g/dL (ref 3.5–5.0)
Alkaline Phosphatase: 32 U/L — ABNORMAL LOW (ref 38–126)
Anion gap: 12 (ref 5–15)
BUN: 5 mg/dL — ABNORMAL LOW (ref 6–20)
CO2: 20 mmol/L — ABNORMAL LOW (ref 22–32)
Calcium: 9 mg/dL (ref 8.9–10.3)
Chloride: 107 mmol/L (ref 98–111)
Creatinine, Ser: 0.76 mg/dL (ref 0.44–1.00)
GFR calc Af Amer: >60 mL/min (ref >60)
GFR calc non Af Amer: >60 mL/min (ref >60)
Glucose, Bld: 100 mg/dL — ABNORMAL HIGH (ref 70–99)
Potassium: 3.8 mmol/L (ref 3.5–5.1)
Sodium: 139 mmol/L (ref 135–145)
Total Bilirubin: 0.8 mg/dL (ref 0.3–1.2)
Total Protein: 7.9 g/dL (ref 6.5–8.1)

## 2019-09-17 LAB — URINALYSIS, ROUTINE W REFLEX MICROSCOPIC
Bacteria, UA: NONE SEEN
Bilirubin Urine: NEGATIVE
Glucose, UA: NEGATIVE mg/dL
Hgb urine dipstick: NEGATIVE
Ketones, ur: 5 mg/dL — AB
Nitrite: NEGATIVE
Protein, ur: 100 mg/dL — AB
Specific Gravity, Urine: 1.03 (ref 1.005–1.030)
pH: 5 (ref 5.0–8.0)

## 2019-09-17 LAB — CBC
HCT: 39.6 % (ref 36.0–46.0)
Hemoglobin: 13.2 g/dL (ref 12.0–15.0)
MCH: 31.4 pg (ref 26.0–34.0)
MCHC: 33.3 g/dL (ref 30.0–36.0)
MCV: 94.3 fL (ref 80.0–100.0)
Platelets: 255 10*3/uL (ref 150–400)
RBC: 4.2 MIL/uL (ref 3.87–5.11)
RDW: 12.2 % (ref 11.5–15.5)
WBC: 5.4 10*3/uL (ref 4.0–10.5)
nRBC: 0 % (ref 0.0–0.2)

## 2019-09-17 LAB — WET PREP, GENITAL
Sperm: NONE SEEN
Trich, Wet Prep: NONE SEEN
Yeast Wet Prep HPF POC: NONE SEEN

## 2019-09-17 LAB — LIPASE, BLOOD: Lipase: 32 U/L (ref 11–51)

## 2019-09-17 LAB — I-STAT BETA HCG BLOOD, ED (MC, WL, AP ONLY): I-stat hCG, quantitative: <5 m[IU]/mL (ref <5)

## 2019-09-17 MED ORDER — METRONIDAZOLE 500 MG PO TABS: 500.0000 mg | ORAL_TABLET | Freq: Two times a day (BID) | ORAL | 0 refills | Status: DC

## 2019-09-17 MED ORDER — ONDANSETRON 4 MG PO TBDP: 4.0000 mg | ORAL_TABLET | Freq: Three times a day (TID) | ORAL | 0 refills | Status: DC | PRN

## 2019-09-17 NOTE — Discharge Instructions (Signed)
Return if any problems.

## 2019-09-17 NOTE — ED Provider Notes (Signed)
[facility] EMERGENCY DEPARTMENT Provider Note   CSN: [id number] Arrival date & time: 09/17/19  1404     History   Chief Complaint Chief Complaint  Patient presents with  . Abdominal Pain    HPI Kara Castillo is a 23 y.o. female.     The history is provided by the patient. No language interpreter was used.  Vaginal Discharge Quality:  Watery Severity:  Moderate Onset quality:  Gradual Timing:  Constant Context: not after intercourse   Relieved by:  Nothing Associated symptoms: abdominal pain   Risk factors: no STI exposure and no unprotected sex   Pt complains of a vaginal discharge.  Pt reports no std risk.   Past Medical History:  Diagnosis Date  . Bacterial vaginosis   . Otitis media     Patient Active Problem List   Diagnosis Date Noted  . Hirsutism 05/29/2019  . Screening examination for STD (sexually transmitted disease) 05/24/2019  . Mood swings 03/29/2019  . Anxiety 03/29/2019  . Depression, recurrent ([place]) 03/29/2019  . Atypical squamous cells of undetermined significance (ASCUS) on Papanicolaou smear of cervix 10/12/2018  . Birth control counseling 10/05/2018  . Lower abdominal pain 10/05/2018  . Major depressive disorder, recurrent episode, moderate ([place]) 11/17/2016    Past Surgical History:  Procedure Laterality Date  . TYMPANOSTOMY TUBE PLACEMENT       OB History   No obstetric history on file.      Home Medications    Prior to Admission medications   Medication Sig Start Date End Date Taking? Authorizing Provider  cetirizine-pseudoephedrine (ZYRTEC-D) 5-120 MG tablet Take 1 tablet by mouth 2 (two) times daily. 01/31/19   Breeback, Jade L, PA-C  fluticasone (FLONASE) 50 MCG/ACT nasal spray Place 2 sprays into both nostrils daily. 10/21/18   Breeback, [name] L, PA-C  metroNIDAZOLE (FLAGYL) 500 MG tablet Take 1 tablet (500 mg total) by mouth 2 (two) times daily. 09/17/19   Fransico Meadow, PA-C  norethindrone-ethinyl  estradiol (JUNEL FE,GILDESS FE,LOESTRIN FE) 1-20 MG-MCG tablet Take 1 tablet by mouth daily. 03/14/19   Breeback, Jade L, PA-C  sertraline (ZOLOFT) 50 MG tablet Take one and one-half tablet daily. 05/24/19   Breeback, Royetta Car, PA-C  spironolactone (ALDACTONE) 50 MG tablet Take 1 tablet (50 mg total) by mouth daily. 05/24/19   Breeback, Jade L, PA-C  triamcinolone cream (KENALOG) 0.1 % Apply 1 application topically 2 (two) times daily. 05/24/19   Donella Stade, PA-C    Family History Family History  Problem Relation Age of Onset  . Hypertension Maternal Grandmother     Social History Social History   Tobacco Use  . Smoking status: Never Smoker  . Smokeless tobacco: Never Used  Substance Use Topics  . Alcohol use: Not on file  . Drug use: Not on file     Allergies   Penicillins   Review of Systems Review of Systems  Gastrointestinal: Positive for abdominal pain.  Genitourinary: Positive for vaginal discharge.  All other systems reviewed and are negative.    Physical Exam Updated Vital Signs BP 140/90   Pulse 100   Temp 98.5 F (36.9 C) (Oral)   Resp 16   LMP 09/09/2019 (Exact Date)   SpO2 98%   Physical Exam Constitutional:      Appearance: She is well-developed.  HENT:     Head: Normocephalic and atraumatic.  Eyes:     Conjunctiva/sclera: Conjunctivae normal.     Pupils: Pupils are equal, round, and  reactive to light.  Neck:     Musculoskeletal: Normal range of motion and neck supple.  Cardiovascular:     Rate and Rhythm: Normal rate.     Heart sounds: Normal heart sounds.  Pulmonary:     Effort: Pulmonary effort is normal.  Abdominal:     General: Abdomen is flat. Bowel sounds are normal.     Palpations: Abdomen is soft.     Tenderness: There is no abdominal tenderness.  Genitourinary:    Vagina: Vaginal discharge present.     Cervix: Normal.     Uterus: Normal.      Adnexa: Right adnexa normal and left adnexa normal.     Comments: Vaginal  discharge,  Thick white,  Adnexa no masses,  Cervix nontender Pt points to an area of irritation  (no obvious lesion)  Musculoskeletal: Normal range of motion.  Skin:    General: Skin is warm.      ED Treatments / Results  Labs (all labs ordered are listed, but only abnormal results are displayed) Labs Reviewed  WET PREP, GENITAL - Abnormal; Notable for the following components:      Result Value   Clue Cells Wet Prep HPF POC PRESENT (*)    WBC, Wet Prep HPF POC FEW (*)    All other components within normal limits  COMPREHENSIVE METABOLIC PANEL - Abnormal; Notable for the following components:   CO2 20 (*)    Glucose, Bld 100 (*)    BUN 5 (*)    Alkaline Phosphatase 32 (*)    All other components within normal limits  URINALYSIS, ROUTINE W REFLEX MICROSCOPIC - Abnormal; Notable for the following components:   Color, Urine AMBER (*)    APPearance HAZY (*)    Ketones, ur 5 (*)    Protein, ur 100 (*)    Leukocytes,Ua TRACE (*)    All other components within normal limits  LIPASE, BLOOD  CBC  I-STAT BETA HCG BLOOD, ED ([facility], [facility], [facility] ONLY)  GC/CHLAMYDIA PROBE AMP ([place]) NOT AT [facility]    EKG None  Radiology No results found.  Procedures Procedures (including critical care time)  Medications Ordered in ED Medications - No data to display   Initial Impression / Assessment and Plan / ED Course  I have reviewed the triage vital signs and the nursing notes.  Pertinent labs & imaging results that were available during my care of the patient were reviewed by me and considered in my medical decision making (see chart for details).        MDM  Pt has clue cells.  Pt advised to follow up with her provider    Final Clinical Impressions(s) / ED Diagnoses   Final diagnoses:  Bacterial vaginosis    ED Discharge Orders         Ordered    metroNIDAZOLE (FLAGYL) 500 MG tablet  2 times daily     09/17/19 1830           Osie Cheeks 09/17/19 Manus Rudd, MD 09/19/19 2226

## 2019-09-17 NOTE — ED Triage Notes (Signed)
Patient c/o RUQ abdominal pain onset of Wednesday. States pain is worse with movement and intermittent. C/o lack of appetite and nausea. Patient adds some vaginal discharge and a bump on her vagina x 1 week.

## 2019-09-19 LAB — GC/CHLAMYDIA PROBE AMP (~~LOC~~) NOT AT ARMC
Chlamydia: NEGATIVE
Neisseria Gonorrhea: NEGATIVE

## 2019-10-13 ENCOUNTER — Ambulatory Visit (INDEPENDENT_AMBULATORY_CARE_PROVIDER_SITE_OTHER): Payer: 59 | Admitting: Physician Assistant

## 2019-10-13 DIAGNOSIS — Z5329 Procedure and treatment not carried out because of patient's decision for other reasons: Secondary | ICD-10-CM

## 2019-10-13 NOTE — Progress Notes (Signed)
No show

## 2019-10-24 ENCOUNTER — Other Ambulatory Visit: Payer: Self-pay

## 2019-10-24 ENCOUNTER — Encounter: Payer: Self-pay | Admitting: Physician Assistant

## 2019-10-24 ENCOUNTER — Ambulatory Visit (INDEPENDENT_AMBULATORY_CARE_PROVIDER_SITE_OTHER): Payer: 59 | Admitting: Physician Assistant

## 2019-10-24 VITALS — BP 119/64 | HR 76 | Ht 66.5 in | Wt 232.0 lb

## 2019-10-24 DIAGNOSIS — N898 Other specified noninflammatory disorders of vagina: Secondary | ICD-10-CM | POA: Diagnosis not present

## 2019-10-24 DIAGNOSIS — N760 Acute vaginitis: Secondary | ICD-10-CM | POA: Diagnosis not present

## 2019-10-24 DIAGNOSIS — B9689 Other specified bacterial agents as the cause of diseases classified elsewhere: Secondary | ICD-10-CM

## 2019-10-24 DIAGNOSIS — Z113 Encounter for screening for infections with a predominantly sexual mode of transmission: Secondary | ICD-10-CM

## 2019-10-24 MED ORDER — TINIDAZOLE 500 MG PO TABS: 2.0000 g | ORAL_TABLET | Freq: Every day | ORAL | 0 refills | Status: DC

## 2019-10-24 NOTE — Progress Notes (Signed)
Subjective:    Patient ID: Kara Castillo, female    DOB: March 21, 1996, 23 y.o.   MRN: [id number]  HPI  Patient is a 23 year old female who presents to the clinic to follow-up on vaginal discharge.  She has a history of BV.  She admits she did not finish the metronidazole due to bad taste in her mouth and making her nauseated.  She has had more more discharge lately.  She denies any vaginal pain or burning.  She has had a new partner within the last month.  She denies any distinct odors.  She denies any vaginal itching.  She denies any abdominal pain or flank pain she has not tried anything recently to make the discharge better.  .. Active Ambulatory Problems    Diagnosis Date Noted  . Major depressive disorder, recurrent episode, moderate ([facility]) 11/17/2016  . Birth control counseling 10/05/2018  . Lower abdominal pain 10/05/2018  . Atypical squamous cells of undetermined significance (ASCUS) on Papanicolaou smear of cervix 10/12/2018  . Mood swings 03/29/2019  . Anxiety 03/29/2019  . Depression, recurrent ([facility]) 03/29/2019  . Screening examination for STD (sexually transmitted disease) 05/24/2019  . Hirsutism 05/29/2019   Resolved Ambulatory Problems    Diagnosis Date Noted  . Maxillary sinusitis, acute 11/08/2013   Past Medical History:  Diagnosis Date  . Bacterial vaginosis   . Otitis media     Review of Systems See HPI>     Objective:   Physical Exam Vitals signs reviewed.  Constitutional:      Appearance: Normal appearance. She is obese.  Cardiovascular:     Rate and Rhythm: Normal rate.  Pulmonary:     Effort: Pulmonary effort is normal.     Breath sounds: Normal breath sounds.  Abdominal:     General: There is no distension.     Tenderness: There is no abdominal tenderness. There is no right CVA tenderness, left CVA tenderness, guarding or rebound.  Genitourinary:    General: Normal vulva.     Vagina: Vaginal discharge present.     Rectum: Normal.     Comments:  Cervix pale with discharge in the vault of vagina that is clear to white and thin to thick.  No adnexal tenderness.  Neurological:     General: No focal deficit present.     Mental Status: She is alert and oriented to person, place, and time.  Psychiatric:        Mood and Affect: Mood normal.           Assessment & Plan:  Marland KitchenMarland KitchenKylin was seen today for vaginal discharge and vaginal itching.  Diagnoses and all orders for this visit:  Vaginal discharge -     tinidazole (TINDAMAX) 500 MG tablet; Take 4 tablets (2,000 mg total) by mouth daily with breakfast. For 2 days -     C. trachomatis/N. gonorrhoeae RNA -     WET PREP FOR TRICH, YEAST, CLUE  Screening examination for STD (sexually transmitted disease)  BV (bacterial vaginosis) -     tinidazole (TINDAMAX) 500 MG tablet; Take 4 tablets (2,000 mg total) by mouth daily with breakfast. For 2 days   I suspect the discharge is likely bacterial vaginosis due to her history and symptoms as well as physical exam.  I do not see any discharge that was an indicator for STD.  She has had a new partner change.  We will screen for gonorrhea and chlamydia today.  Wet prep was also ordered that we will  get back faster.  She did not finish her previous metronidazole will send over Tindamax for 2 days.  This should treat her bacterial vaginosis.  This does seem to be a recurrent problem for patient.  Spent some time discussing vaginal pH goals of less than 4.5 and using boric acid to help keep the vagina more acidic.

## 2019-10-24 NOTE — Progress Notes (Signed)
Patient ID: Kara Castillo, female   DOB: 01/06/1996, 23 y.o.   MRN: [id number]

## 2019-10-25 ENCOUNTER — Encounter: Payer: Self-pay | Admitting: Physician Assistant

## 2019-10-25 LAB — WET PREP FOR TRICH, YEAST, CLUE
MICRO NUMBER:: 1134471
Specimen Quality: ADEQUATE

## 2019-10-25 LAB — C. TRACHOMATIS/N. GONORRHOEAE RNA
C. trachomatis RNA, TMA: NOT DETECTED
N. gonorrhoeae RNA, TMA: NOT DETECTED

## 2019-10-25 MED ORDER — METRONIDAZOLE 1.3 % VA GEL: 1.0000 "application " | Freq: Every day | VAGINAL | 0 refills | Status: DC

## 2019-10-25 NOTE — Progress Notes (Signed)
Negative GC/Chlamydia.

## 2019-10-25 NOTE — Progress Notes (Signed)
Kara Castillo,   STD testing is not back but wet prep was positive for BV. Take medication sent to pharmacy.   [name]

## 2019-11-01 ENCOUNTER — Telehealth: Payer: Self-pay | Admitting: Neurology

## 2019-11-01 DIAGNOSIS — B9689 Other specified bacterial agents as the cause of diseases classified elsewhere: Secondary | ICD-10-CM

## 2019-11-01 DIAGNOSIS — N760 Acute vaginitis: Secondary | ICD-10-CM

## 2019-11-01 MED ORDER — METRONIDAZOLE 0.75 % VA GEL: 1.0000 | Freq: Every day | VAGINAL | Status: AC

## 2019-11-01 MED ORDER — METRONIDAZOLE 500 MG PO TABS: 500.0000 mg | ORAL_TABLET | Freq: Two times a day (BID) | ORAL | 0 refills | Status: DC

## 2019-11-01 NOTE — Telephone Encounter (Signed)
Received note from pharmacy that Metronidazole 1.3% gel not available. They have Metronidazole 6.38% gel with 5 applicators. They asked if they can change RX? Please advise.

## 2019-11-01 NOTE — Addendum Note (Signed)
Addended by: Donella Stade on: 11/01/2019 11:29 AM   Modules accepted: Orders

## 2019-11-01 NOTE — Telephone Encounter (Signed)
Yes  sent

## 2020-02-12 ENCOUNTER — Other Ambulatory Visit: Payer: Self-pay

## 2020-02-12 ENCOUNTER — Telehealth (INDEPENDENT_AMBULATORY_CARE_PROVIDER_SITE_OTHER): Payer: 59 | Admitting: Physician Assistant

## 2020-02-12 ENCOUNTER — Encounter: Payer: Self-pay | Admitting: Physician Assistant

## 2020-02-12 VITALS — Temp 97.0°F | Ht 66.5 in | Wt 217.0 lb

## 2020-02-12 DIAGNOSIS — J039 Acute tonsillitis, unspecified: Secondary | ICD-10-CM | POA: Diagnosis not present

## 2020-02-12 DIAGNOSIS — J0100 Acute maxillary sinusitis, unspecified: Secondary | ICD-10-CM

## 2020-02-12 MED ORDER — AZITHROMYCIN 250 MG PO TABS: ORAL_TABLET | ORAL | 0 refills | Status: DC

## 2020-02-12 MED ORDER — PREDNISONE 50 MG PO TABS: ORAL_TABLET | ORAL | 0 refills | Status: DC

## 2020-02-12 NOTE — Progress Notes (Signed)
Patient ID: Kara Castillo, female   DOB: November 28, 1996, 24 y.o.   MRN: [id number] .Marland KitchenVirtual Visit via Video Note  I connected with Kara Castillo on 02/12/2020 at  1:20 PM EDT by a video enabled telemedicine application and verified that I am speaking with the correct person using two identifiers.  Location: Patient: work Provider: clinic   I discussed the limitations of evaluation and management by telemedicine and the availability of in person appointments. The patient expressed understanding and agreed to proceed.  History of Present Illness: Pt is a 24 yo female who calls into the clinic with nasal congestion, ST, swollen tonsils, tonsilar exudate, chills, fatigue worsening for the last 3 days. She has had allergy symptoms for the last 2 months but this weekend everything worsened. No fever, loss of smell or taste, GI symptoms, SOB. She has hx of tonsilitis and sinusitis. She is taking mucinex, throat spray, cough drops and ibuprofen. This helps some. No other sick contacts. No trouble breathing.   .. Active Ambulatory Problems    Diagnosis Date Noted  . Major depressive disorder, recurrent episode, moderate (HCC) 11/17/2016  . Birth control counseling 10/05/2018  . Lower abdominal pain 10/05/2018  . Atypical squamous cells of undetermined significance (ASCUS) on Papanicolaou smear of cervix 10/12/2018  . Mood swings 03/29/2019  . Anxiety 03/29/2019  . Depression, recurrent (HCC) 03/29/2019  . Screening examination for STD (sexually transmitted disease) 05/24/2019  . Hirsutism 05/29/2019   Resolved Ambulatory Problems    Diagnosis Date Noted  . Maxillary sinusitis, acute 11/08/2013   Past Medical History:  Diagnosis Date  . Bacterial vaginosis   . Otitis media    Reviewed med, allergy, problem list.     Observations/Objective: No acute distress.  Normal breathing.  Fatigued appearance.  Not able to see throat due to bad connection.  Pt reports tender and swollen bilateral  lymph nodes in neck, swollen tonsils and white exudate.  Tenderness over maxillary sinuses.   .. Today's Vitals   02/12/20 1111  Temp: (!) 97 F (36.1 C)  TempSrc: Oral  Weight: 217 lb (98.4 kg)  Height: 5' 6.5" (1.689 m)   Body mass index is 34.5 kg/m.    Assessment and Plan: Marland KitchenMarland KitchenJosette was seen today for sore throat.  Diagnoses and all orders for this visit:  Acute non-recurrent maxillary sinusitis -     azithromycin (ZITHROMAX) 250 MG tablet; Take 2 tablets now and then one tablet 4 days. -     predniSONE (DELTASONE) 50 MG tablet; Take one tablet for 5 days.  Acute tonsillitis, unspecified etiology -     azithromycin (ZITHROMAX) 250 MG tablet; Take 2 tablets now and then one tablet 4 days. -     predniSONE (DELTASONE) 50 MG tablet; Take one tablet for 5 days.   Symptoms seem like building for the last 2 weeks. Sounds like sinusitis and tonsillitis. Treated with zpak due to PCN allergy and prednisone. Rest and hydrate. Gargle with salt water, ok to take ibuprofen. Follow up if not improving or if new symptoms.     Follow Up Instructions:    I discussed the assessment and treatment plan with the patient. The patient was provided an opportunity to ask questions and all were answered. The patient agreed with the plan and demonstrated an understanding of the instructions.   The patient was advised to call back or seek an in-person evaluation if the symptoms worsen or if the condition fails to improve as anticipated.  I provided 15 minutes  of non-face-to-face time during this encounter.   Iran Planas, PA-C

## 2020-02-12 NOTE — Progress Notes (Signed)
Started Friday/Saturday Sore throat/swollen tonsils/some white on tonsils Nasal congestion (ongoing x 2 months) Was having excessive sweating Slept most the day yesterday  No other symptoms  Taking mucinex/ throat spray/cough drops/ibuprofen Does help some

## 2020-04-17 ENCOUNTER — Ambulatory Visit: Payer: 59 | Admitting: Family Medicine

## 2020-04-18 ENCOUNTER — Ambulatory Visit: Payer: 59 | Admitting: Family Medicine

## 2020-05-02 ENCOUNTER — Telehealth: Payer: Self-pay | Admitting: Physician Assistant

## 2020-05-02 NOTE — Telephone Encounter (Signed)
Advised [name] to go to urgent care via [name] due to a spider bite.  She was able to peel off her skin and it had a yellow appearance.

## 2020-05-02 NOTE — Telephone Encounter (Signed)
Discussed with Harrison Mons, agree with recommendations. No appointments available in our office today.

## 2020-05-03 NOTE — Telephone Encounter (Signed)
Agree with plan 

## 2020-05-29 ENCOUNTER — Ambulatory Visit: Payer: 59 | Admitting: Physician Assistant

## 2020-11-30 NOTE — L&D Delivery Note (Signed)
Delivery Note Pt pushed x 2 contractions. There was a 80 second delay from delivery of head to delivery of body due to compound posterior (right arm). No Shoulder dystocia encountered. Unable to deliver body w/ gentle downward pressure of head. CNM attempted to grasp posterior axilla and arm but was unable to grasp either due to unusual position of arm. Anterior axilla was grasped and baby's body delivered easily w/ gentle traction. At 1:22 PM a viable female was delivered via Vaginal, Spontaneous (Presentation: Right Occiput Anterior). Baby w/ good HR, but slow to transition after stimulation and bulb suction. Cord clamed x 2 and cut by family member quickly and taken to warmer. Tone an 02 Sats normalized w/ RN interventions. See note. Brought back to Mom. APGAR: 7, 9; weight  7 lb 3.7 oz.   Placenta status: Spontaneous, Intact.  Cord: 2 vessels with the following complications: None.  Cord pH: NA  Anesthesia: Epidural Episiotomy: None Lacerations: None Suture Repair:  NA Est. Blood Loss (mL): 375  Mom to postpartum.  Baby to Couplet care / Skin to Skin.  Dorathy Kinsman 11/27/2021, 2:48 PM

## 2021-03-30 ENCOUNTER — Inpatient Hospital Stay (HOSPITAL_COMMUNITY)
Admission: AD | Admit: 2021-03-30 | Discharge: 2021-03-30 | Disposition: A | Discharge status: Home / Self Care | Payer: 59 | Attending: Obstetrics and Gynecology | Admitting: Obstetrics and Gynecology

## 2021-03-30 ENCOUNTER — Inpatient Hospital Stay: Admission: AD | Admit: 2021-03-30 | Payer: 59 | Source: Home / Self Care | Admitting: Obstetrics and Gynecology

## 2021-03-30 ENCOUNTER — Inpatient Hospital Stay (HOSPITAL_COMMUNITY): Payer: 59

## 2021-03-30 ENCOUNTER — Encounter (HOSPITAL_COMMUNITY): Payer: Self-pay

## 2021-03-30 ENCOUNTER — Other Ambulatory Visit: Payer: Self-pay

## 2021-03-30 DIAGNOSIS — R109 Unspecified abdominal pain: Secondary | ICD-10-CM | POA: Diagnosis present

## 2021-03-30 DIAGNOSIS — Z880 Allergy status to penicillin: Secondary | ICD-10-CM | POA: Insufficient documentation

## 2021-03-30 DIAGNOSIS — Z3491 Encounter for supervision of normal pregnancy, unspecified, first trimester: Secondary | ICD-10-CM

## 2021-03-30 DIAGNOSIS — Z3A01 Less than 8 weeks gestation of pregnancy: Secondary | ICD-10-CM | POA: Diagnosis not present

## 2021-03-30 DIAGNOSIS — Z79899 Other long term (current) drug therapy: Secondary | ICD-10-CM | POA: Diagnosis not present

## 2021-03-30 DIAGNOSIS — O26891 Other specified pregnancy related conditions, first trimester: Secondary | ICD-10-CM | POA: Diagnosis not present

## 2021-03-30 LAB — URINALYSIS, ROUTINE W REFLEX MICROSCOPIC
Bilirubin Urine: NEGATIVE
Glucose, UA: NEGATIVE mg/dL
Hgb urine dipstick: NEGATIVE
Ketones, ur: NEGATIVE mg/dL
Leukocytes,Ua: NEGATIVE
Nitrite: NEGATIVE
Protein, ur: NEGATIVE mg/dL
Specific Gravity, Urine: 1.017 (ref 1.005–1.030)
pH: 9 — ABNORMAL HIGH (ref 5.0–8.0)

## 2021-03-30 LAB — CBC
HCT: 38.5 % (ref 36.0–46.0)
Hemoglobin: 12.8 g/dL (ref 12.0–15.0)
MCH: 32.4 pg (ref 26.0–34.0)
MCHC: 33.2 g/dL (ref 30.0–36.0)
MCV: 97.5 fL (ref 80.0–100.0)
Platelets: 317 10*3/uL (ref 150–400)
RBC: 3.95 MIL/uL (ref 3.87–5.11)
RDW: 11.6 % (ref 11.5–15.5)
WBC: 6.7 10*3/uL (ref 4.0–10.5)
nRBC: 0 % (ref 0.0–0.2)

## 2021-03-30 LAB — WET PREP, GENITAL
Sperm: NONE SEEN
Trich, Wet Prep: NONE SEEN
Yeast Wet Prep HPF POC: NONE SEEN

## 2021-03-30 LAB — HCG, QUANTITATIVE, PREGNANCY: hCG, Beta Chain, Quant, S: 6403 m[IU]/mL — ABNORMAL HIGH (ref <5)

## 2021-03-30 LAB — ABO/RH
ABO/RH(D): A NEG
Antibody Screen: NEGATIVE

## 2021-03-30 NOTE — Discharge Instructions (Signed)
Return to care  If you have heavier bleeding that soaks through more that 2 pads per hour for an hour or more If you bleed so much that you feel like you might pass out or you do pass out If you have significant abdominal pain that is not improved with Tylenol     Safe Medications in Pregnancy   Acne: Benzoyl Peroxide Salicylic Acid  Backache/Headache: Tylenol: 2 regular strength every 4 hours OR              2 Extra strength every 6 hours  Colds/Coughs/Allergies: Benadryl (alcohol free) 25 mg every 6 hours as needed Breath right strips Claritin Cepacol throat lozenges Chloraseptic throat spray Cold-Eeze- up to three times per day Cough drops, alcohol free Flonase (by prescription only) Guaifenesin Mucinex Robitussin DM (plain only, alcohol free) Saline nasal spray/drops Sudafed (pseudoephedrine) & Actifed ** use only after [redacted] weeks gestation and if you do not have high blood pressure Tylenol Vicks Vaporub Zinc lozenges Zyrtec   Constipation: Colace Ducolax suppositories Fleet enema Glycerin suppositories Metamucil Milk of magnesia Miralax Senokot Smooth move tea  Diarrhea: Kaopectate Imodium A-D  *NO pepto Bismol  Hemorrhoids: Anusol Anusol HC Preparation H Tucks  Indigestion: Tums Maalox Mylanta Zantac  Pepcid  Insomnia: Benadryl (alcohol free) 25mg every 6 hours as needed Tylenol PM Unisom, no Gelcaps  Leg Cramps: Tums MagGel  Nausea/Vomiting:  Bonine Dramamine Emetrol Ginger extract Sea bands Meclizine  Nausea medication to take during pregnancy:  Unisom (doxylamine succinate 25 mg tablets) Take one tablet daily at bedtime. If symptoms are not adequately controlled, the dose can be increased to a maximum recommended dose of two tablets daily (1/2 tablet in the morning, 1/2 tablet mid-afternoon and one at bedtime). Vitamin B6 100mg tablets. Take one tablet twice a day (up to 200 mg per day).  Skin Rashes: Aveeno  products Benadryl cream or 25mg every 6 hours as needed Calamine Lotion 1% cortisone cream  Yeast infection: Gyne-lotrimin 7 Monistat 7  Gum/tooth pain: Anbesol  **If taking multiple medications, please check labels to avoid duplicating the same active ingredients **take medication as directed on the label ** Do not exceed 4000 mg of tylenol in 24 hours **Do not take medications that contain aspirin or ibuprofen    

## 2021-03-30 NOTE — MAU Provider Note (Signed)
History     CSN: [id number]  Arrival date and time: 03/30/21 0932   Event Date/Time   First Provider Initiated Contact with Patient 03/30/21 1034      Chief Complaint  Patient presents with  . Abdominal Pain   HPI Kara Castillo is a 25 y.o. G2P0010 at [redacted]w[redacted]d who presents with abdominal pain. Reports intermittent sharp pains in right suprapubic area that started last night. Rates pain 4/10. Nothing makes better or worse. Hasn't treated symptoms. Had 1 episode of diarrhea yesterday. Denies fever/chills, n/v, dysuria, vaginal bleeding, or abnormal vaginal discharge.   OB History    Gravida  2   Para      Term      Preterm      AB  1   Living        SAB      IAB      Ectopic      Multiple      Live Births              Past Medical History:  Diagnosis Date  . Bacterial vaginosis   . Otitis media     Past Surgical History:  Procedure Laterality Date  . TYMPANOSTOMY TUBE PLACEMENT      Family History  Problem Relation Age of Onset  . Hypertension Maternal Grandmother     Social History   Tobacco Use  . Smoking status: Never Smoker  . Smokeless tobacco: Never Used  Substance Use Topics  . 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 Penicillins Other (See Comments)    Unknown. Childhood    Medications Prior to Admission  Medication Sig Dispense Refill Last Dose  . ferrous sulfate 325 (65 FE) MG EC tablet Take 325 mg by mouth 3 (three) times daily with meals.   03/29/2021 at Unknown time  . Prenatal Vit-Fe Fumarate-FA (MULTIVITAMIN-PRENATAL) 27-0.8 MG TABS tablet Take 1 tablet by mouth daily at 12 noon.   03/29/2021 at Unknown time  . fluticasone (FLONASE) 50 MCG/ACT nasal spray Place 2 sprays into both nostrils daily. 16 g 6 Unknown at Unknown time  . triamcinolone cream (KENALOG) 0.1 % Apply 1 application topically 2 (two) times daily. 30 g 1     Review of  Systems  Constitutional: Negative.   Gastrointestinal: Positive for abdominal pain and diarrhea. Negative for constipation, nausea and vomiting.  Genitourinary: Negative.    Physical Exam   Blood pressure 115/80, pulse 88, temperature 98.3 F (36.8 C), temperature source Oral, resp. rate 17, height 5\' 6"  (1.676 m), weight 82.4 kg, last menstrual period 02/23/2021, SpO2 100 %.  Physical Exam Vitals and nursing note reviewed.  Constitutional:      General: She is not in acute distress.    Appearance: She is well-developed.  HENT:     Head: Normocephalic and atraumatic.  Eyes:     General: No scleral icterus. Pulmonary:     Effort: Pulmonary effort is normal. No respiratory distress.  Abdominal:     General: Abdomen is flat.     Palpations: Abdomen is soft.     Tenderness: There is no abdominal tenderness.  Skin:    General: Skin is warm and dry.  Neurological:     Mental Status: She is alert.  Psychiatric:        Mood and Affect: Mood normal.  Behavior: Behavior normal.     MAU Course  Procedures Results for orders placed or performed during the hospital encounter of 03/30/21 (from the past 24 hour(s))  Urinalysis, Routine w reflex microscopic Urine, Clean Catch     Status: Abnormal   Collection Time: 03/30/21 10:03 AM  Result Value Ref Range   Color, Urine YELLOW YELLOW   APPearance CLOUDY (A) CLEAR   Specific Gravity, Urine 1.017 1.005 - 1.030   pH 9.0 (H) 5.0 - 8.0   Glucose, UA NEGATIVE NEGATIVE mg/dL   Hgb urine dipstick NEGATIVE NEGATIVE   Bilirubin Urine NEGATIVE NEGATIVE   Ketones, ur NEGATIVE NEGATIVE mg/dL   Protein, ur NEGATIVE NEGATIVE mg/dL   Nitrite NEGATIVE NEGATIVE   Leukocytes,Ua NEGATIVE NEGATIVE  Wet prep, genital     Status: Abnormal   Collection Time: 03/30/21 10:42 AM   Specimen: PATH Cytology Cervicovaginal Ancillary Only  Result Value Ref Range   Yeast Wet Prep HPF POC NONE SEEN NONE SEEN   Trich, Wet Prep NONE SEEN NONE SEEN    Clue Cells Wet Prep HPF POC PRESENT (A) NONE SEEN   WBC, Wet Prep HPF POC MODERATE (A) NONE SEEN   Sperm NONE SEEN   CBC     Status: None   Collection Time: 03/30/21 11:19 AM  Result Value Ref Range   WBC 6.7 4.0 - 10.5 K/uL   RBC 3.95 3.87 - 5.11 MIL/uL   Hemoglobin 12.8 12.0 - 15.0 g/dL   HCT 81.1 57.2 - 62.0 %   MCV 97.5 80.0 - 100.0 fL   MCH 32.4 26.0 - 34.0 pg   MCHC 33.2 30.0 - 36.0 g/dL   RDW 35.5 97.4 - 16.3 %   Platelets 317 150 - 400 K/uL   nRBC 0.0 0.0 - 0.2 %  ABO/Rh     Status: None   Collection Time: 03/30/21 11:19 AM  Result Value Ref Range   ABO/RH(D) A NEG    Antibody Screen      NEG Performed at [facility], [address] [place], [place] [address]   hCG, quantitative, pregnancy     Status: Abnormal   Collection Time: 03/30/21 11:19 AM  Result Value Ref Range   hCG, Beta Chain, Quant, S 6,403 (H) <5 mIU/mL   US OB LESS THAN 14 WEEKS WITH OB TRANSVAGINAL  Result Date: 03/30/2021 CLINICAL DATA:  Pregnant patient.  Pain. EXAM: OBSTETRIC <14 WK [place] AND TRANSVAGINAL OB US TECHNIQUE: Both transabdominal and transvaginal ultrasound examinations were performed for complete evaluation of the gestation as well as the maternal uterus, adnexal regions, and pelvic cul-de-sac. Transvaginal technique was performed to assess early pregnancy. COMPARISON:  None. FINDINGS: Intrauterine gestational sac: Single Yolk sac:  Visualized. Embryo:  Visualized. Cardiac Activity: Not Visualized. MSD: 7.4 mm   5 w   3 d CRL:  1.5 mm    w    d Subchorionic hemorrhage:  None visualized. Maternal uterus/adnexae: The ovaries are normal in appearance. Mild fluid in the cul-de-sac. IMPRESSION: 1. An IUP is identified. No cardiac activity is seen in the fetal pole which measures 1.5 mm. Findings are suspicious but not yet definitive for failed pregnancy. Recommend follow-up US in 10-14 days for definitive diagnosis. This recommendation follows SRU consensus guidelines: Diagnostic Criteria for  Nonviable Pregnancy Early in the First Trimester. Malva Limes Med 2013; 468:0321-22. 2. A small amount of fluid in the cul-de-sac is likely physiologic. Electronically Signed   By: Gerome Sam III M.D  On: 03/30/2021 14:06    MDM +UPT UA, wet prep, GC/chlamydia, CBC, ABO/Rh, quant hCG, and [place] today to rule out ectopic pregnancy which can be life threatening.   Wet prep + clue cells. Patient denies abnormal discharge. Using shared decision making will defer treatment - pt will contact office if develops concerning symptoms.   Ultrasound shows IUP. Embryo is 1.5 mm with no cardiac activity. Likely due to early gestational age but can't exclude failed pregnancy. Will get outpatient ultrasound in 10 days for viability. Patient is already scheduled for [facility] ob app[facility] at [facility] next month.   Assessment and Plan   1. Normal IUP (intrauterine pregnancy) on prenatal ultrasound, first trimester   2. Abdominal pain during pregnancy in first trimester   3. [redacted] weeks gestation of pregnancy    -outpatient f/u ultrasound in 10 days ordered -GC/CT pending -reviewed SAB precautions & reasons to return to MAU  Judeth Horn 03/30/2021, 2:20 PM

## 2021-03-30 NOTE — MAU Note (Signed)
Pt reports to mau with c/o lower abd cramping that started yesterday.  Pt denies bleeding.

## 2021-03-31 LAB — GC/CHLAMYDIA PROBE AMP (~~LOC~~) NOT AT ARMC
Chlamydia: NEGATIVE
Comment: NEGATIVE
Comment: NORMAL
Neisseria Gonorrhea: NEGATIVE

## 2021-04-05 ENCOUNTER — Telehealth: Payer: Self-pay | Admitting: Certified Nurse Midwife

## 2021-04-05 NOTE — Telephone Encounter (Signed)
Pt called in to ask questions about the ultrasound report scanned into her chart from her 03/30/21 visit and ask whether she should treat her BV.   Explained all the of components of the scan and gave reassurance that we do not treat BV if the patient is asymptomatic unless requested to. Pt having discharge but feels it is normal as it has no smell and is not making her uncomfortable. Continues to have occasional mild cramping but no vaginal bleeding. Has her first appointment in June, but after our convo requested to be put on my schedule. Will adjust appointments accordingly.  Edd Arbour, CNM, MSN, IBCLC Certified Nurse Midwife, [facility]

## 2021-05-09 ENCOUNTER — Ambulatory Visit (INDEPENDENT_AMBULATORY_CARE_PROVIDER_SITE_OTHER): Payer: 59

## 2021-05-09 ENCOUNTER — Other Ambulatory Visit: Payer: Self-pay

## 2021-05-09 VITALS — BP 126/75 | HR 90 | Ht 67.0 in | Wt 172.3 lb

## 2021-05-09 DIAGNOSIS — Z3481 Encounter for supervision of other normal pregnancy, first trimester: Secondary | ICD-10-CM

## 2021-05-09 DIAGNOSIS — Z3491 Encounter for supervision of normal pregnancy, unspecified, first trimester: Secondary | ICD-10-CM | POA: Insufficient documentation

## 2021-05-09 DIAGNOSIS — Z3A11 11 weeks gestation of pregnancy: Secondary | ICD-10-CM

## 2021-05-09 DIAGNOSIS — O3680X0 Pregnancy with inconclusive fetal viability, not applicable or unspecified: Secondary | ICD-10-CM

## 2021-05-09 DIAGNOSIS — Z3480 Encounter for supervision of other normal pregnancy, unspecified trimester: Secondary | ICD-10-CM | POA: Insufficient documentation

## 2021-05-09 NOTE — Progress Notes (Signed)
New OB Intake  I connected with  Kara Castillo on 05/09/21 at 10:15 AM EDT by in person. Video Visit and verified that I am speaking with the correct person using two identifiers. Nurse is located at [facility] and pt is located at [place].  I discussed the limitations, risks, security and privacy concerns of performing an evaluation and management service by telephone and the availability of in person appointments. I also discussed with the patient that there may be a patient responsible charge related to this service. The patient expressed understanding and agreed to proceed.  I explained I am completing New OB Intake today. We discussed her EDD 11/23/21 of  that is based on early u/s 05/09/21. Pt is G2/P0010. I reviewed her allergies, medications, Medical/Surgical/OB history, and appropriate screenings. I informed her of [facility] services. Based on history, this is a/an  pregnancy uncomplicated .   Patient Active Problem List   Diagnosis Date Noted   Hirsutism 05/29/2019   Screening examination for STD (sexually transmitted disease) 05/24/2019   Mood swings 03/29/2019   Anxiety 03/29/2019   Depression, recurrent (HCC) 03/29/2019   Atypical squamous cells of undetermined significance (ASCUS) on Papanicolaou smear of cervix 10/12/2018   Birth control counseling 10/05/2018   Lower abdominal pain 10/05/2018   Major depressive disorder, recurrent episode, moderate (HCC) 11/17/2016    Concerns addressed today  Delivery Plans:  Plans to deliver at [facility].   MyChart/Babyscripts MyChart access verified. I explained pt will have some visits in office and some virtually. Babyscripts instructions given and order placed. Patient verifies receipt of registration text/e-mail. Account successfully created and app downloaded.  Blood Pressure Cuff  Patient has private insurance; instructed to purchase blood pressure cuff and bring to first prenatal appt. Explained after first prenatal appt pt will check  weekly and document in Babyscripts.  Weight scale: Patient    have weight scale. Weight scale ordered   Anatomy US Explained first scheduled [place] will be around 19 weeks. Dating and viability scan performed today.  Labs Discussed Avelina Laine genetic screening with patient. Would like both Panorama and Horizon drawn at new OB visit. Routine prenatal labs needed.  Covid Vaccine Patient has not covid vaccine.   Social Determinants of Health Food Insecurity: Patient denies food insecurity. WIC Referral: Patient is interested in referral to [facility]  Transportation: Patient denies transportation needs. Childcare: Discussed no children allowed at ultrasound appointments. Offered childcare services; patient declines childcare services at this time.  First visit review I reviewed new OB appt with pt. I explained she will have a pelvic exam, ob bloodwork with genetic screening, and PAP smear. Explained pt will be seen by Nolene Bernheim at first visit; encounter routed to appropriate provider. Explained that patient will be seen by pregnancy navigator following visit with provider. [facility] information placed in AVS.   Hamilton Capri, RN 05/09/2021  10:20 AM

## 2021-05-12 NOTE — Progress Notes (Signed)
Chart reviewed for nurse visit. Agree with plan of care.   Punam Broussard M, MD 05/12/21 11:10 AM 

## 2021-05-16 ENCOUNTER — Ambulatory Visit (INDEPENDENT_AMBULATORY_CARE_PROVIDER_SITE_OTHER): Payer: 59 | Admitting: Nurse Practitioner

## 2021-05-16 ENCOUNTER — Other Ambulatory Visit (HOSPITAL_COMMUNITY)
Admission: RE | Admit: 2021-05-16 | Discharge: 2021-05-16 | Disposition: A | Discharge status: Home / Self Care | Payer: 59 | Source: Ambulatory Visit | Attending: Nurse Practitioner | Admitting: Nurse Practitioner

## 2021-05-16 ENCOUNTER — Other Ambulatory Visit: Payer: Self-pay

## 2021-05-16 VITALS — BP 124/70 | HR 79 | Wt 179.8 lb

## 2021-05-16 DIAGNOSIS — Z3481 Encounter for supervision of other normal pregnancy, first trimester: Secondary | ICD-10-CM

## 2021-05-16 DIAGNOSIS — N760 Acute vaginitis: Secondary | ICD-10-CM

## 2021-05-16 DIAGNOSIS — Z3A11 11 weeks gestation of pregnancy: Secondary | ICD-10-CM

## 2021-05-16 DIAGNOSIS — O210 Mild hyperemesis gravidarum: Secondary | ICD-10-CM

## 2021-05-16 DIAGNOSIS — B9689 Other specified bacterial agents as the cause of diseases classified elsewhere: Secondary | ICD-10-CM

## 2021-05-16 MED ORDER — VITAFOL GUMMIES 3.33-0.333-34.8 MG PO CHEW
3.0000 | CHEWABLE_TABLET | Freq: Every day | ORAL | 11 refills | Status: DC
Start: 2021-05-16 — End: 2021-06-19

## 2021-05-16 MED ORDER — DOXYLAMINE-PYRIDOXINE 10-10 MG PO TBEC: DELAYED_RELEASE_TABLET | ORAL | 2 refills | Status: DC

## 2021-05-16 NOTE — Progress Notes (Signed)
NOB in office for initial visit. Intake and U/S completed on 05-09-21. Pt reports occasional back pain.

## 2021-05-16 NOTE — Patient Instructions (Signed)
[email or web address] is the website for classes.  Sign up for childbirth and breastfeeding classes by the time you are 28 weeks.

## 2021-05-16 NOTE — Progress Notes (Signed)
Subjective:   Kara Castillo is a 25 y.o. G2P0010 at [redacted]w[redacted]d by LMP being seen today for her first obstetrical visit.  Her obstetrical history is significant for  100 pound weight loss prior to pregnancy and prior history of depression . Patient does intend to breast feed. Pregnancy history fully reviewed.  Mother is accompanying her to the visit today.  Patient reports nausea and vomiting.  HISTORY: OB History  Gravida Para Term Preterm AB Living  2 0 0 0 1 0  SAB IAB Ectopic Multiple Live Births  0 1 0 0 0    # Outcome Date GA Lbr Len/2nd Weight Sex Delivery Anes PTL Lv  2 Current           1 IAB 2019           Past Medical History:  Diagnosis Date   Anxiety    Bacterial vaginosis    Major depressive disorder    Otitis media    Past Surgical History:  Procedure Laterality Date   TYMPANOSTOMY TUBE PLACEMENT     Family History  Problem Relation Age of Onset   Diabetes Mother    Stroke Maternal Grandmother    Hyperlipidemia Maternal Grandmother    Heart failure Maternal Grandmother    Diabetes Maternal Grandmother    Hypertension Maternal Grandmother    Prostate cancer Paternal Grandfather    Social History   Tobacco Use   Smoking status: Never   Smokeless tobacco: Never  Vaping Use   Vaping Use: Former  Substance Use Topics   Alcohol use: Not Currently    Comment: before she found out she was pregnant.   Drug use: Yes    Types: Marijuana    Comment: marijuana and tobacco mixed togther   Allergies  Allergen Reactions   Penicillins Other (See Comments)    Unknown. Childhood   Current Outpatient Medications on File Prior to Visit  Medication Sig Dispense Refill   ferrous sulfate 325 (65 FE) MG EC tablet Take 325 mg by mouth 3 (three) times daily with meals.     fluticasone (FLONASE) 50 MCG/ACT nasal spray Place 2 sprays into both nostrils daily. 16 g 6   Prenatal Vit-Fe Fumarate-FA (MULTIVITAMIN-PRENATAL) 27-0.8 MG TABS tablet Take 1 tablet by mouth daily  at 12 noon.     No current facility-administered medications on file prior to visit.     Exam   Vitals:   05/16/21 0933  BP: 124/70  Pulse: 79  Weight: 179 lb 12.8 oz (81.6 kg)   Fetal Heart Rate (bpm): 160  Uterus:     Pelvic Exam: Perineum: no hemorrhoids, normal perineum; sagging skin noted in upper thighs consistent with reported weight loss   Vulva: normal external genitalia, no lesions   Vagina:  normal mucosa, normal discharge   Cervix: no lesions and normal, pap smear done.    Adnexa: normal adnexa and no mass, fullness, tenderness   Bony Pelvis: average  System: General: well-developed, well-nourished female in no acute distress   Breast:  normal appearance, no masses or tenderness   Skin: normal coloration and turgor, no rashes   Neurologic: oriented, normal, negative, normal mood   Extremities: normal strength, tone, and muscle mass, ROM of all joints is normal   HEENT extraocular movement intact and sclera clear, anicteric   Mouth/Teeth deferred   Neck supple and no masses, normal thyroid   Cardiovascular: regular rate and rhythm   Respiratory:  no respiratory distress, normal breath  sounds   Abdomen: soft, non-tender; no masses,  no organomegaly     Assessment:   Pregnancy: G2P0010 Patient Active Problem List   Diagnosis Date Noted   Encounter for supervision of normal pregnancy in first trimester 05/09/2021   Hirsutism 05/29/2019   Screening examination for STD (sexually transmitted disease) 05/24/2019   Mood swings 03/29/2019   Anxiety 03/29/2019   Depression, recurrent (HCC) 03/29/2019   Birth control counseling 10/05/2018   Lower abdominal pain 10/05/2018   Major depressive disorder, recurrent episode, moderate (HCC) 11/17/2016     Plan:  1. Encounter for supervision of other normal pregnancy in first trimester Prescribed PNV Advised about childbirth and breastfeeding classes Will get MyChart and Babyscript apps on her phone Will need to  discuss anxiety and depression at her next visit - not discussed at today's visit  - Culture, OB Urine - Genetic Screening - Obstetric Panel, Including HIV - Hepatitis C Antibody - Cytology - PAP( [facility]) - Cervicovaginal ancillary only( [facility]) - HgB A1c  2. Morning sickness Will try Dicegis   Initial labs drawn. Continue prenatal vitamins. Genetic Screening discussed, NIPS: ordered. Ultrasound discussed; fetal anatomic survey:  to be ordered at next visit . Problem list reviewed and updated. The nature of [facility] with multiple MDs and other Advanced Practice Providers was explained to patient; also emphasized that residents, students are part of our team. Routine obstetric precautions reviewed. Return in about 4 weeks (around 06/13/2021) for in person ROB.  Total face-to-face time with patient: 40 minutes.  Over 50% of encounter was spent on counseling and coordination of care.     Nolene Bernheim, FNP Family Nurse Practitioner, [facility] for [employer], [facility] 05/16/2021 12:49 PM

## 2021-05-17 ENCOUNTER — Encounter: Payer: Self-pay | Admitting: Nurse Practitioner

## 2021-05-17 DIAGNOSIS — Z2839 Other underimmunization status: Secondary | ICD-10-CM | POA: Insufficient documentation

## 2021-05-17 DIAGNOSIS — O09899 Supervision of other high risk pregnancies, unspecified trimester: Secondary | ICD-10-CM | POA: Insufficient documentation

## 2021-05-17 LAB — OBSTETRIC PANEL, INCLUDING HIV
Antibody Screen: NEGATIVE
Basophils Absolute: 0 10*3/uL (ref 0.0–0.2)
Basos: 0 %
EOS (ABSOLUTE): 0 10*3/uL (ref 0.0–0.4)
Eos: 0 %
HIV Screen 4th Generation wRfx: NONREACTIVE
Hematocrit: 37.6 % (ref 34.0–46.6)
Hemoglobin: 12.7 g/dL (ref 11.1–15.9)
Hepatitis B Surface Ag: NEGATIVE
Immature Grans (Abs): 0 10*3/uL (ref 0.0–0.1)
Immature Granulocytes: 0 %
Lymphocytes Absolute: 2.4 10*3/uL (ref 0.7–3.1)
Lymphs: 31 %
MCH: 32.4 pg (ref 26.6–33.0)
MCHC: 33.8 g/dL (ref 31.5–35.7)
MCV: 96 fL (ref 79–97)
Monocytes Absolute: 0.4 10*3/uL (ref 0.1–0.9)
Monocytes: 5 %
Neutrophils Absolute: 5 10*3/uL (ref 1.4–7.0)
Neutrophils: 64 %
Platelets: 306 10*3/uL (ref 150–450)
RBC: 3.92 x10E6/uL (ref 3.77–5.28)
RDW: 11.9 % (ref 11.7–15.4)
RPR Ser Ql: NONREACTIVE
Rh Factor: NEGATIVE
Rubella Antibodies, IGG: <0.9 index — ABNORMAL LOW (ref >0.99)
WBC: 7.8 10*3/uL (ref 3.4–10.8)

## 2021-05-17 LAB — HEMOGLOBIN A1C
Est. average glucose Bld gHb Est-mCnc: 97 mg/dL
Hgb A1c MFr Bld: 5 % (ref 4.8–5.6)

## 2021-05-17 LAB — HEPATITIS C ANTIBODY: Hep C Virus Ab: <0.1 s/co ratio (ref 0.0–0.9)

## 2021-05-18 LAB — URINE CULTURE, OB REFLEX: Organism ID, Bacteria: NO GROWTH

## 2021-05-18 LAB — CULTURE, OB URINE

## 2021-05-20 ENCOUNTER — Ambulatory Visit (INDEPENDENT_AMBULATORY_CARE_PROVIDER_SITE_OTHER): Payer: 59 | Admitting: Licensed Clinical Social Worker

## 2021-05-20 DIAGNOSIS — F419 Anxiety disorder, unspecified: Secondary | ICD-10-CM

## 2021-05-20 LAB — CYTOLOGY - PAP
Comment: NEGATIVE
High risk HPV: POSITIVE — AB

## 2021-05-20 LAB — CERVICOVAGINAL ANCILLARY ONLY
Bacterial Vaginitis (gardnerella): POSITIVE — AB
Candida Glabrata: NEGATIVE
Candida Vaginitis: NEGATIVE
Chlamydia: NEGATIVE
Comment: NEGATIVE
Comment: NEGATIVE
Comment: NEGATIVE
Comment: NEGATIVE
Comment: NEGATIVE
Comment: NORMAL
Neisseria Gonorrhea: NEGATIVE
Trichomonas: NEGATIVE

## 2021-05-21 ENCOUNTER — Encounter: Payer: Self-pay | Admitting: Nurse Practitioner

## 2021-05-21 DIAGNOSIS — R87612 Low grade squamous intraepithelial lesion on cytologic smear of cervix (LGSIL): Secondary | ICD-10-CM | POA: Insufficient documentation

## 2021-05-21 MED ORDER — METRONIDAZOLE 500 MG PO TABS: 500.0000 mg | ORAL_TABLET | Freq: Two times a day (BID) | ORAL | 0 refills | Status: DC

## 2021-05-21 NOTE — BH Specialist Note (Addendum)
Integrated Behavioral Health via Telemedicine Visit  05/21/2021 Kara Castillo [id number]  Number of Integrated Behavioral Health visits: 1/6 Session Start time: 10:04am  Session End time: 10:35am Total time: 31 mins via phone due to pt cell battery low   Referring Provider: Lilyan Punt NP Patient/Family location: Home  [facility] Provider location: [facility] All persons participating in visit: Pt K Ranker and LCSWA A. Dalayza Zambrana Types of Service: Individual psychotherapy and General Behavioral Integrated Care (BHI)  I connected with [name] and/or [name] n/a via  Telephone or Video Enabled Telemedicine Application  (Video is Caregility application) and verified that I am speaking with the correct person using two identifiers. Discussed confidentiality: Yes   I discussed the limitations of telemedicine and the availability of in person appointments.  Discussed there is a possibility of technology failure and discussed alternative modes of communication if that failure occurs.  I discussed that engaging in this telemedicine visit, they consent to the provision of behavioral healthcare and the services will be billed under their insurance.  Patient and/or legal guardian expressed understanding and consented to Telemedicine visit: Yes   Presenting Concerns: Patient and/or family reports the following symptoms/concerns: anxiety, history of depression  Duration of problem: approx 4 months ; Severity of problem: mild  Patient and/or Family's Strengths/Protective Factors: Concrete supports in place (healthy food, safe environments, etc.)  Goals Addressed: Patient will:  Reduce symptoms of: anxiety and depression   Increase knowledge and/or ability of: coping skills and healthy habits   Demonstrate ability to: Increase healthy adjustment to current life circumstances  Progress towards Goals: Ongoing  Interventions: Interventions utilized:  Motivational Interviewing and Supportive  Counseling Standardized Assessments completed: PHQ 9   Assessment: Patient currently experiencing anxiety affecting pregnancy.   Patient may benefit from integrated behavioral health.  Plan: Follow up with behavioral health clinician on : 3 weeks via mychart  Behavioral recommendations: Engage in stress reducing activity to prevent burnout, write journal to identify emotional triggers and alleviate overthinking, communicate needs with support system Referral(s): Integrated [employer] (In Clinic)  I discussed the assessment and treatment plan with the patient and/or parent/guardian. They were provided an opportunity to ask questions and all were answered. They agreed with the plan and demonstrated an understanding of the instructions.   They were advised to call back or seek an in-person evaluation if the symptoms worsen or if the condition fails to improve as anticipated.  [name], LCSW

## 2021-05-21 NOTE — Addendum Note (Signed)
Addended by: Currie Paris on: 05/21/2021 05:47 PM   Modules accepted: Orders

## 2021-05-26 ENCOUNTER — Encounter: Payer: Self-pay | Admitting: Nurse Practitioner

## 2021-05-30 ENCOUNTER — Encounter: Payer: Self-pay | Admitting: Nurse Practitioner

## 2021-06-13 ENCOUNTER — Encounter: Payer: Self-pay | Admitting: Obstetrics

## 2021-06-13 ENCOUNTER — Ambulatory Visit (INDEPENDENT_AMBULATORY_CARE_PROVIDER_SITE_OTHER): Payer: 59 | Admitting: Obstetrics

## 2021-06-13 ENCOUNTER — Other Ambulatory Visit: Payer: Self-pay

## 2021-06-13 ENCOUNTER — Ambulatory Visit (INDEPENDENT_AMBULATORY_CARE_PROVIDER_SITE_OTHER): Payer: 59 | Admitting: Physician Assistant

## 2021-06-13 VITALS — BP 117/71 | HR 71 | Wt 173.5 lb

## 2021-06-13 DIAGNOSIS — Z3481 Encounter for supervision of other normal pregnancy, first trimester: Secondary | ICD-10-CM

## 2021-06-13 DIAGNOSIS — B9689 Other specified bacterial agents as the cause of diseases classified elsewhere: Secondary | ICD-10-CM

## 2021-06-13 DIAGNOSIS — Z5329 Procedure and treatment not carried out because of patient's decision for other reasons: Secondary | ICD-10-CM

## 2021-06-13 DIAGNOSIS — O219 Vomiting of pregnancy, unspecified: Secondary | ICD-10-CM

## 2021-06-13 DIAGNOSIS — N760 Acute vaginitis: Secondary | ICD-10-CM

## 2021-06-13 MED ORDER — METRONIDAZOLE 0.75 % VA GEL: 1.0000 | Freq: Two times a day (BID) | VAGINAL | 5 refills | Status: DC

## 2021-06-13 MED ORDER — PROMETHAZINE HCL 12.5 MG PO TABS: 12.5000 mg | ORAL_TABLET | Freq: Four times a day (QID) | ORAL | 2 refills | Status: DC | PRN

## 2021-06-13 NOTE — Progress Notes (Signed)
Pt is unsure if she is feeling fetal movement yet. Pt reports occasional back pain. Pt cannot take metronidazole due to nausea, would like to try metrogel.

## 2021-06-13 NOTE — Progress Notes (Signed)
Subjective:  Kara Castillo is a 25 y.o. G2P0010 at [redacted]w[redacted]d being seen today for ongoing prenatal care.  She is currently monitored for the following issues for this low-risk pregnancy and has Major depressive disorder, recurrent episode, moderate (HCC); Anxiety; Depression, recurrent (HCC); Hirsutism; Encounter for supervision of normal pregnancy in first trimester; Rubella non-immune status, antepartum; and Pap smear abnormality of cervix with LGSIL on their problem list.  Patient reports no complaints.  Contractions: Not present. Vag. Bleeding: None.   . Denies leaking of fluid.   The following portions of the patient's history were reviewed and updated as appropriate: allergies, current medications, past family history, past medical history, past social history, past surgical history and problem list. Problem list updated.  Objective:   Vitals:   06/13/21 0834  BP: 117/71  Pulse: 71  Weight: 173 lb 8 oz (78.7 kg)    Fetal Status:           General:  Alert, oriented and cooperative. Patient is in no acute distress.  Skin: Skin is warm and dry. No rash noted.   Cardiovascular: Normal heart rate noted  Respiratory: Normal respiratory effort, no problems with respiration noted  Abdomen: Soft, gravid, appropriate for gestational age. Pain/Pressure: Present     Pelvic:  Cervical exam deferred        Extremities: Normal range of motion.  Edema: None  Mental Status: Normal mood and affect. Normal behavior. Normal judgment and thought content.   Urinalysis:      Assessment and Plan:  Pregnancy: G2P0010 at [redacted]w[redacted]d  1. Encounter for supervision of other normal pregnancy in first trimester Rx: - Babyscripts Schedule Optimization - [place] MFM OB COMP + 14 WK; Future  2. BV (bacterial vaginosis) Rx: - metroNIDAZOLE (METROGEL VAGINAL) 0.75 % vaginal gel; Place 1 Applicatorful vaginally 2 (two) times daily.  Dispense: 70 g; Refill: 5  3. Nausea and vomiting during pregnancy Rx: - promethazine  (PHENERGAN) 12.5 MG tablet; Take 1 tablet (12.5 mg total) by mouth every 6 (six) hours as needed for nausea or vomiting.  Dispense: 30 tablet; Refill: 2   Preterm labor symptoms and general obstetric precautions including but not limited to vaginal bleeding, contractions, leaking of fluid and fetal movement were reviewed in detail with the patient. Please refer to After Visit Summary for other counseling recommendations.   Return in about 4 weeks (around 07/11/2021) for ROB.   Brock Bad, MD  06/13/21

## 2021-06-17 ENCOUNTER — Telehealth: Payer: Self-pay | Admitting: Licensed Clinical Social Worker

## 2021-06-17 ENCOUNTER — Encounter: Payer: 59 | Admitting: Licensed Clinical Social Worker

## 2021-06-17 NOTE — Telephone Encounter (Signed)
Called Pt regarding scheduled mychart visit. Unable to leave voicemail requesting callback. Text message Kara Castillo Mychart video link

## 2021-06-19 ENCOUNTER — Encounter: Payer: Self-pay | Admitting: Physician Assistant

## 2021-06-19 ENCOUNTER — Ambulatory Visit (INDEPENDENT_AMBULATORY_CARE_PROVIDER_SITE_OTHER): Payer: 59 | Admitting: Physician Assistant

## 2021-06-19 ENCOUNTER — Other Ambulatory Visit: Payer: Self-pay

## 2021-06-19 VITALS — BP 96/69 | HR 100 | Ht 67.0 in | Wt 174.0 lb

## 2021-06-19 DIAGNOSIS — Z0000 Encounter for general adult medical examination without abnormal findings: Secondary | ICD-10-CM

## 2021-06-19 DIAGNOSIS — Z1329 Encounter for screening for other suspected endocrine disorder: Secondary | ICD-10-CM | POA: Diagnosis not present

## 2021-06-19 DIAGNOSIS — E611 Iron deficiency: Secondary | ICD-10-CM | POA: Diagnosis not present

## 2021-06-19 DIAGNOSIS — O219 Vomiting of pregnancy, unspecified: Secondary | ICD-10-CM

## 2021-06-19 NOTE — Patient Instructions (Signed)
Preventive Care 21-24 Years Old, Female Preventive care refers to lifestyle choices and visits with your health care provider that can promote health and wellness. This includes: A yearly physical exam. This is also called an annual wellness visit. Regular dental and eye exams. Immunizations. Screening for certain conditions. Healthy lifestyle choices, such as: Eating a healthy diet. Getting regular exercise. Not using drugs or products that contain nicotine and tobacco. Limiting alcohol use. What can I expect for my preventive care visit? Physical exam Your health care provider may check your: Height and weight. These may be used to calculate your BMI (body mass index). BMI is a measurement that tells if you are at a healthy weight. Heart rate and blood pressure. Body temperature. Skin for abnormal spots. Counseling Your health care provider may ask you questions about your: Past medical problems. Family's medical history. Alcohol, tobacco, and drug use. Emotional well-being. Home life and relationship well-being. Sexual activity. Diet, exercise, and sleep habits. Work and work environment. Access to firearms. Method of birth control. Menstrual cycle. Pregnancy history. What immunizations do I need?  Vaccines are usually given at various ages, according to a schedule. Your health care provider will recommend vaccines for you based on your age, medicalhistory, and lifestyle or other factors, such as travel or where you work. What tests do I need?  Blood tests Lipid and cholesterol levels. These may be checked every 5 years starting at age 20. Hepatitis C test. Hepatitis B test. Screening Diabetes screening. This is done by checking your blood sugar (glucose) after you have not eaten for a while (fasting). STD (sexually transmitted disease) testing, if you are at risk. BRCA-related cancer screening. This may be done if you have a family history of breast, ovarian, tubal, or  peritoneal cancers. Pelvic exam and Pap test. This may be done every 3 years starting at age 21. Starting at age 30, this may be done every 5 years if you have a Pap test in combination with an HPV test. Talk with your health care provider about your test results, treatment options,and if necessary, the need for more tests. Follow these instructions at home: Eating and drinking  Eat a healthy diet that includes fresh fruits and vegetables, whole grains, lean protein, and low-fat dairy products. Take vitamin and mineral supplements as recommended by your health care provider. Do not drink alcohol if: Your health care provider tells you not to drink. You are pregnant, may be pregnant, or are planning to become pregnant. If you drink alcohol: Limit how much you have to 0-1 drink a day. Be aware of how much alcohol is in your drink. In the [place] one drink equals one 12 oz bottle of beer (355 mL), one 5 oz glass of wine (148 mL), or one 1 oz glass of hard liquor (44 mL).  Lifestyle Take daily care of your teeth and gums. Brush your teeth every morning and night with fluoride toothpaste. Floss one time each day. Stay active. Exercise for at least 30 minutes 5 or more days each week. Do not use any products that contain nicotine or tobacco, such as cigarettes, e-cigarettes, and chewing tobacco. If you need help quitting, ask your health care provider. Do not use drugs. If you are sexually active, practice safe sex. Use a condom or other form of protection to prevent STIs (sexually transmitted infections). If you do not wish to become pregnant, use a form of birth control. If you plan to become pregnant, see your health care   provider for a prepregnancy visit. Find healthy ways to cope with stress, such as: Meditation, yoga, or listening to music. Journaling. Talking to a trusted person. Spending time with friends and family. Safety Always wear your seat belt while driving or riding in a  vehicle. Do not drive: If you have been drinking alcohol. Do not ride with someone who has been drinking. When you are tired or distracted. While texting. Wear a helmet and other protective equipment during sports activities. If you have firearms in your house, make sure you follow all gun safety procedures. Seek help if you have been physically or sexually abused. What's next? Go to your health care provider once a year for an annual wellness visit. Ask your health care provider how often you should have your eyes and teeth checked. Stay up to date on all vaccines. This information is not intended to replace advice given to you by your health care provider. Make sure you discuss any questions you have with your healthcare provider. Document Revised: 07/14/2020 Document Reviewed: 07/28/2018 Elsevier Patient Education  2022 [employer].

## 2021-06-19 NOTE — Progress Notes (Signed)
Established Patient Office Visit  Subjective:  Patient ID: Kara Castillo, female    DOB: [birth year]  Age: 25 y.o. 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 Taking Prenatal vitamin and iron. Pap UTD - LSIL and HPV positive on 05/16/21  She is still having morning nausea and single episode of vomiting. Reports eating when hungry and gets hungry sporadically. Currently not eating meat due to feeling an aversion.  Denies hx of purging. Endorses approx 100 lb weight loss over 1 year that was intentional  Past Medical History:  Diagnosis Date   Anxiety    Bacterial vaginosis    Major depressive disorder    Otitis media     Past Surgical History:  Procedure Laterality Date   THERAPEUTIC ABORTION     TYMPANOSTOMY TUBE PLACEMENT      Family History  Problem Relation Age of Onset   Diabetes Mother    Stroke Maternal Grandmother    Hyperlipidemia Maternal Grandmother    Heart failure Maternal Grandmother    Diabetes Maternal Grandmother    Hypertension Maternal Grandmother    Prostate cancer Paternal Grandfather     Social History   Socioeconomic History   Marital status: Single    Spouse name: Not on file   Number of children: Not on file   Years of education: Not on file   Highest education level: Not on file  Occupational History   Not on file  Tobacco Use   Smoking status: Never   Smokeless tobacco: Never  Vaping Use   Vaping Use: Former  Substance and Sexual Activity   Alcohol use: Not Currently    Comment: before she found out she was pregnant.   Drug use: Not Currently    Types:  Marijuana    Comment: marijuana and tobacco mixed togther   Sexual activity: Yes    Partners: Male    Birth control/protection: None  Other Topics Concern   Not on file  Social History Narrative   Lives with mom.    Social Determinants of Health   Financial Resource Strain: Not on file  Food Insecurity: Not on file  Transportation Needs: Not on file  Physical Activity: Not on file  Stress: Not on file  Social Connections: Not on file  Intimate Partner Violence: Not on file    Outpatient Medications Prior to Visit  Medication Sig Dispense Refill   ferrous sulfate 325 (65 FE) MG EC tablet Take 325 mg by mouth 3 (three) times daily with meals.     fluticasone (FLONASE) 50 MCG/ACT nasal spray Place 2 sprays into both nostrils daily. 16 g 6   Prenatal Vit-Fe Fumarate-FA (MULTIVITAMIN-PRENATAL) 27-0.8 MG TABS tablet Take 1 tablet by mouth daily at 12 noon.     metroNIDAZOLE (METROGEL VAGINAL) 0.75 % vaginal gel Place 1 Applicatorful vaginally 2 (two) times daily. (Patient not taking: Reported on 06/19/2021) 70 g 5   promethazine (PHENERGAN) 12.5 MG tablet Take 1 tablet (12.5 mg total) by mouth every 6 (six) hours as needed for nausea or vomiting. (Patient not taking: Reported on  06/19/2021) 30 tablet 2   Doxylamine-Pyridoxine (DICLEGIS) 10-10 MG TBEC Take 2 tablets at bedtime and one in the morning and one in the afternoon as needed for nausea. (Patient not taking: Reported on 06/13/2021) 60 tablet 2   Prenatal Vit-Fe Phos-FA-Omega (VITAFOL GUMMIES) 3.33-0.333-34.8 MG CHEW Chew 3 each by mouth daily. (Patient not taking: Reported on 06/13/2021) 90 tablet 11   No facility-administered medications prior to visit.    Allergies  Allergen Reactions   Penicillins Other (See Comments)    Unknown. Childhood    ROS Review of Systems  Constitutional:  Negative for unexpected weight change.  Gastrointestinal:  Positive for nausea and vomiting.  Psychiatric/Behavioral:  Positive for dysphoric  mood. The patient is nervous/anxious.   All other systems reviewed and are negative.    Objective:    Physical Exam  BP 96/69   Pulse 100   Ht 5\' 7"  (1.702 m)   Wt 174 lb (78.9 kg)   LMP 02/23/2021   SpO2 98%   BMI 27.25 kg/m  Wt Readings from Last 3 Encounters:  06/19/21 174 lb (78.9 kg)  06/13/21 173 lb 8 oz (78.7 kg)  05/16/21 179 lb 12.8 oz (81.6 kg)   Pulse Readings from Last 3 Encounters:  06/19/21 100  06/13/21 71  05/16/21 79    BP Readings from Last 3 Encounters:  06/19/21 96/69  06/13/21 117/71  05/16/21 124/70   General Appearance:  Alert, cooperative, no distress, appropriate for age                            Head:  Normocephalic, without obvious abnormality                             Eyes:  PERRL, EOM's intact, conjunctiva and cornea clear                             Ears:  TM pearly gray color and semitransparent, external ear canals normal, both ears                            Nose:  Nares symmetrical, mucosa pink                          Throat:  Lips, tongue, and mucosa are moist, pink, and intact; oropharynx clear, uvula midline; good dentition                             Neck:  Supple; symmetrical, trachea midline, no adenopathy; thyroid: no enlargement, symmetric, no tenderness/mass/nodules                             Back:  Symmetrical, no curvature, ROM normal               Chest/Breast:  deferred                           Lungs:  Clear to auscultation bilaterally, respirations unlabored                             Heart:  normal rate &  regular rhythm, S1 and S2 normal, no murmurs, rubs, or gallops                     Abdomen:  Soft, non-tender, no mass or organomegaly; palpated uterine fundus              Genitourinary:  deferred         Musculoskeletal:  Tone and strength strong and symmetrical, all extremities; no joint pain or edema, normal gait and station                                   Lymphatic:  No adenopathy              Skin/Hair/Nails:  Skin warm, dry and intact, no rashes or abnormal dyspigmentation on limited exam                   Neurologic:  Alert and oriented x3, no cranial nerve deficits, sensation grossly intact, normal gait and station, no tremor Psych: well-groomed, cooperative, good eye contact, depressed mood, affect appropriate speech is articulate, and thought processes clear and goal-directed   There are no preventive care reminders to display for this patient.  There are no preventive care reminders to display for this patient.  No results found for: TSH Lab Results  Component Value Date   WBC 7.8 05/16/2021   HGB 12.7 05/16/2021   HCT 37.6 05/16/2021   MCV 96 05/16/2021   PLT 306 05/16/2021   Lab Results  Component Value Date   NA 139 09/17/2019   K 3.8 09/17/2019   CO2 20 (L) 09/17/2019   GLUCOSE 100 (H) 09/17/2019   BUN 5 (L) 09/17/2019   CREATININE 0.76 09/17/2019   BILITOT 0.8 09/17/2019   ALKPHOS 32 (L) 09/17/2019   AST 25 09/17/2019   ALT 10 09/17/2019   PROT 7.9 09/17/2019   ALBUMIN 3.8 09/17/2019   CALCIUM 9.0 09/17/2019   ANIONGAP 12 09/17/2019   No results found for: CHOL No results found for: HDL No results found for: LDLCALC No results found for: TRIG No results found for: CHOLHDL Lab Results  Component Value Date   HGBA1C 5.0 05/16/2021      Assessment & Plan:   Problem List Items Addressed This Visit   None Visit Diagnoses     Routine physical examination    -  Primary   Relevant Orders   COMPLETE METABOLIC PANEL WITH GFR   TSH   Ferritin   Screening, lipid       Screening for diabetes mellitus       Screening for thyroid disorder       Relevant Orders   TSH   Vomiting during pregnancy       Relevant Orders   COMPLETE METABOLIC PANEL WITH GFR   Iron deficiency       Relevant Orders   Ferritin      Preventive care UTD - following with OB and receiving prenatal care and will receive immunizations from OB Will check ferritin to  monitor IDA Will check CMP due to frequent pregnancy related emesis to ensure normal electrolytes/renal/liver enzymes Will check screening TSH  Employment form completed - no restrictions - signed and returned to patient Positive PHQ9 and GAD7 - under care of therapist and good supports Follow-up in 1 year for CPE  No orders of the defined types were placed in this encounter.  Follow-up: No follow-ups on file.    Carlis Stable, [place]

## 2021-06-20 LAB — COMPLETE METABOLIC PANEL WITH GFR
AG Ratio: 1.3 (calc) (ref 1.0–2.5)
ALT: 5 U/L — ABNORMAL LOW (ref 6–29)
AST: 14 U/L (ref 10–30)
Albumin: 3.9 g/dL (ref 3.6–5.1)
Alkaline phosphatase (APISO): 25 U/L — ABNORMAL LOW (ref 31–125)
BUN/Creatinine Ratio: 7 (calc) (ref 6–22)
BUN: 4 mg/dL — ABNORMAL LOW (ref 7–25)
CO2: 26 mmol/L (ref 20–32)
Calcium: 9.5 mg/dL (ref 8.6–10.2)
Chloride: 103 mmol/L (ref 98–110)
Creat: 0.58 mg/dL (ref 0.50–0.96)
Globulin: 2.9 g/dL (calc) (ref 1.9–3.7)
Glucose, Bld: 74 mg/dL (ref 65–99)
Potassium: 4.4 mmol/L (ref 3.5–5.3)
Sodium: 136 mmol/L (ref 135–146)
Total Bilirubin: 0.5 mg/dL (ref 0.2–1.2)
Total Protein: 6.8 g/dL (ref 6.1–8.1)
eGFR: 130 mL/min/{1.73_m2} (ref >=60)

## 2021-06-20 LAB — TSH: TSH: 3.14 mIU/L

## 2021-06-20 LAB — FERRITIN: Ferritin: 45 ng/mL (ref 16–154)

## 2021-07-10 ENCOUNTER — Other Ambulatory Visit: Payer: Self-pay | Admitting: Obstetrics

## 2021-07-10 ENCOUNTER — Ambulatory Visit (HOSPITAL_BASED_OUTPATIENT_CLINIC_OR_DEPARTMENT_OTHER): Payer: 59 | Admitting: Obstetrics and Gynecology

## 2021-07-10 ENCOUNTER — Ambulatory Visit: Payer: 59 | Attending: Obstetrics

## 2021-07-10 ENCOUNTER — Other Ambulatory Visit: Payer: Self-pay

## 2021-07-10 ENCOUNTER — Other Ambulatory Visit: Payer: Self-pay | Admitting: *Deleted

## 2021-07-10 DIAGNOSIS — O09899 Supervision of other high risk pregnancies, unspecified trimester: Secondary | ICD-10-CM

## 2021-07-10 DIAGNOSIS — Z3A19 19 weeks gestation of pregnancy: Secondary | ICD-10-CM

## 2021-07-10 DIAGNOSIS — Z3481 Encounter for supervision of other normal pregnancy, first trimester: Secondary | ICD-10-CM | POA: Diagnosis present

## 2021-07-10 DIAGNOSIS — O3503X0 Maternal care for (suspected) central nervous system malformation or damage in fetus, choroid plexus cysts, not applicable or unspecified: Secondary | ICD-10-CM

## 2021-07-10 DIAGNOSIS — O350XX0 Maternal care for (suspected) central nervous system malformation in fetus, not applicable or unspecified: Secondary | ICD-10-CM

## 2021-07-10 NOTE — Progress Notes (Signed)
Maternal-Fetal Medicine   Name: Kara Castillo DOB: May 16, 1996 MRN: [id number] Referring Provider: Darrell Jewel, MD  I had the pleasure of seeing [name] [facility]. She is G1 P0 at 19-weeks' gestation and is here for fetal anatomy scan. On cell free fetal DNA screening, the risks of fetal aneuploidies are not increased. She reports no chronic medical conditions including diabetes or hypertension.  Ultrasound We performed a fetal anatomical survey.  Single umbilical artery is seen.  Bilateral choroid plexus cysts are seen.  No other markers of aneuploidies or fetal structural defects are seen.  Fetal biometry is consistent with the previously established dates.  Amniotic fluid is normal and good fetal activity seen.  Our concerns include: Single umbilical artery (SUA) SUA is seen in about 1% of fetuses. In the absence of other anomalies, the risk for aneuploidies is not increased. However, ultrasound has limitations in detecting fetal anomalies that may be missed on target at anatomical survey.  SUA can be associated with fetal growth restriction, and we recommend serial growth scans for fetal growth assessment. The association of SUA with cardiac anomalies is not conclusively proven.    I discussed the significance and limitations of cell free fetal DNA screening in detecting aneuploidies.  I informed her only amniocentesis will give a definitive result on the fetal karyotype and some genetic conditions.  Explained amniocentesis procedure and possible complications including miscarriage (1 and 500 procedures).   Patient understands that ultrasound has limitations in detecting fetal anomalies.  She had opted not to have amniocentesis.  Choroid plexus cysts (CPC) I counseled the patient that isolated CPC is only rarely associated with chromosomal anomaly (trisomy 18). I also reassured her that [facility] is not associated with structural malformations in the brain.  CPCs usually resolve with advancing gestation.  Given that she had low risk for fetal trisomy 18 on cell free fetal DNA screening, this should be considered a normal variant.  Recommendations -An appointment was made for her to return in 4 weeks of completion of fetal anatomy. -Fetal growth assessments every 4 weeks till delivery.  Thank you for consultation.  If you have any questions or concerns, please contact me the [facility].  Consultation including face-to-face (more than 50%) counseling 30 minutes.

## 2021-07-11 ENCOUNTER — Ambulatory Visit (INDEPENDENT_AMBULATORY_CARE_PROVIDER_SITE_OTHER): Payer: 59 | Admitting: Obstetrics and Gynecology

## 2021-07-11 VITALS — BP 119/68 | HR 73 | Wt 178.2 lb

## 2021-07-11 DIAGNOSIS — O26899 Other specified pregnancy related conditions, unspecified trimester: Secondary | ICD-10-CM | POA: Insufficient documentation

## 2021-07-11 DIAGNOSIS — Z6791 Unspecified blood type, Rh negative: Secondary | ICD-10-CM

## 2021-07-11 DIAGNOSIS — Q270 Congenital absence and hypoplasia of umbilical artery: Secondary | ICD-10-CM

## 2021-07-11 DIAGNOSIS — Z3481 Encounter for supervision of other normal pregnancy, first trimester: Secondary | ICD-10-CM

## 2021-07-11 DIAGNOSIS — R87612 Low grade squamous intraepithelial lesion on cytologic smear of cervix (LGSIL): Secondary | ICD-10-CM

## 2021-07-11 NOTE — Progress Notes (Signed)
Patient presents for ROB. Patient has no other concerns.

## 2021-07-11 NOTE — Progress Notes (Signed)
   PRENATAL VISIT NOTE  Subjective:  Kara Castillo is a 25 y.o. G2P0010 at [redacted]w[redacted]d being seen today for ongoing prenatal care.  She is currently monitored for the following issues for this low-risk pregnancy and has Major depressive disorder, recurrent episode, moderate (HCC); Anxiety; Depression, recurrent (HCC); Hirsutism; Encounter for supervision of normal pregnancy in first trimester; Rubella non-immune status, antepartum; Pap smear abnormality of cervix with LGSIL; Vomiting during pregnancy; and Iron deficiency on their problem list.  Patient reports no complaints.  Contractions: Irritability. Vag. Bleeding: None.   . Denies leaking of fluid.   The following portions of the patient's history were reviewed and updated as appropriate: allergies, current medications, past family history, past medical history, past social history, past surgical history and problem list.   Objective:   Vitals:   07/11/21 0907  BP: 119/68  Pulse: 73  Weight: 178 lb 3.2 oz (80.8 kg)    Fetal Status:           General:  Alert, oriented and cooperative. Patient is in no acute distress.  Skin: Skin is warm and dry. No rash noted.   Cardiovascular: Normal heart rate noted  Respiratory: Normal respiratory effort, no problems with respiration noted  Abdomen: Soft, gravid, appropriate for gestational age.  Pain/Pressure: Present     Pelvic: Cervical exam deferred        Extremities: Normal range of motion.  Edema: None  Mental Status: Normal mood and affect. Normal behavior. Normal judgment and thought content.   Assessment and Plan:  Pregnancy: G2P0010 at [redacted]w[redacted]d  1. Encounter for supervision of other normal pregnancy in first trimester  - AFP, Serum, Open Spina Bifida  2. Pap smear abnormality of cervix with LGSIL  Reviewed pap results in detail with patient. Will need repeat pap in 1 year.   Preterm labor symptoms and general obstetric precautions including but not limited to vaginal bleeding,  contractions, leaking of fluid and fetal movement were reviewed in detail with the patient. Please refer to After Visit Summary for other counseling recommendations.   No follow-ups on file.  Future Appointments  Date Time Provider Department Center  08/07/2021  3:30 PM [facility] NURSE [facility]  08/07/2021  3:45 PM [facility] US1 [facility]    Venia Carbon, NP

## 2021-07-18 LAB — AFP, SERUM, OPEN SPINA BIFIDA
AFP MoM: 1.45
AFP Value: 74.7 ng/mL
Gest. Age on Collection Date: 19 weeks
Maternal Age At EDD: 25.3 yr
OSBR Risk 1 IN: 6265
Test Results:: NEGATIVE
Weight: 178 [lb_av]

## 2021-08-07 ENCOUNTER — Ambulatory Visit: Payer: Medicaid Other | Attending: Obstetrics and Gynecology

## 2021-08-07 ENCOUNTER — Ambulatory Visit: Payer: Medicaid Other | Admitting: *Deleted

## 2021-08-07 ENCOUNTER — Other Ambulatory Visit: Payer: Self-pay

## 2021-08-07 ENCOUNTER — Other Ambulatory Visit: Payer: Self-pay | Admitting: Maternal & Fetal Medicine

## 2021-08-07 VITALS — BP 135/58 | HR 77

## 2021-08-07 DIAGNOSIS — Z6791 Unspecified blood type, Rh negative: Secondary | ICD-10-CM | POA: Insufficient documentation

## 2021-08-07 DIAGNOSIS — O09899 Supervision of other high risk pregnancies, unspecified trimester: Secondary | ICD-10-CM | POA: Insufficient documentation

## 2021-08-07 DIAGNOSIS — O09892 Supervision of other high risk pregnancies, second trimester: Secondary | ICD-10-CM

## 2021-08-07 DIAGNOSIS — O3503X0 Maternal care for (suspected) central nervous system malformation or damage in fetus, choroid plexus cysts, not applicable or unspecified: Secondary | ICD-10-CM

## 2021-08-07 DIAGNOSIS — O26899 Other specified pregnancy related conditions, unspecified trimester: Secondary | ICD-10-CM | POA: Insufficient documentation

## 2021-08-07 DIAGNOSIS — Z3A23 23 weeks gestation of pregnancy: Secondary | ICD-10-CM

## 2021-08-07 DIAGNOSIS — O358XX0 Maternal care for other (suspected) fetal abnormality and damage, not applicable or unspecified: Secondary | ICD-10-CM | POA: Diagnosis not present

## 2021-08-07 DIAGNOSIS — O350XX0 Maternal care for (suspected) central nervous system malformation in fetus, not applicable or unspecified: Secondary | ICD-10-CM

## 2021-08-08 ENCOUNTER — Ambulatory Visit (INDEPENDENT_AMBULATORY_CARE_PROVIDER_SITE_OTHER): Payer: Medicaid Other | Admitting: Family Medicine

## 2021-08-08 VITALS — BP 125/78 | HR 84 | Wt 181.2 lb

## 2021-08-08 DIAGNOSIS — Z3480 Encounter for supervision of other normal pregnancy, unspecified trimester: Secondary | ICD-10-CM

## 2021-08-08 DIAGNOSIS — Z6791 Unspecified blood type, Rh negative: Secondary | ICD-10-CM

## 2021-08-08 DIAGNOSIS — Q270 Congenital absence and hypoplasia of umbilical artery: Secondary | ICD-10-CM

## 2021-08-08 DIAGNOSIS — Z2839 Other underimmunization status: Secondary | ICD-10-CM

## 2021-08-08 DIAGNOSIS — O26899 Other specified pregnancy related conditions, unspecified trimester: Secondary | ICD-10-CM

## 2021-08-08 NOTE — Progress Notes (Signed)
   PRENATAL VISIT NOTE  Subjective:  Kara Castillo is a 25 y.o. G2P0010 at [redacted]w[redacted]d being seen today for ongoing prenatal care.  She is currently monitored for the following issues for this high-risk pregnancy and has Major depressive disorder, recurrent episode, moderate ([place]); Anxiety; Hirsutism; Supervision of other normal pregnancy, antepartum; Rubella non-immune status, antepartum; Pap smear abnormality of cervix with LGSIL; Vomiting during pregnancy; Iron deficiency; Single umbilical artery; and Rh negative state in antepartum period on their problem list.  Patient reports no complaints.  Contractions: Not present. Vag. Bleeding: None.  Movement: Present. Denies leaking of fluid.   The following portions of the patient's history were reviewed and updated as appropriate: allergies, current medications, past family history, past medical history, past social history, past surgical history and problem list.   Objective:   Vitals:   08/08/21 0946  BP: 125/78  Pulse: 84  Weight: 181 lb 3.2 oz (82.2 kg)    Fetal Status: Fetal Heart Rate (bpm): 153   Movement: Present     General:  Alert, oriented and cooperative. Patient is in no acute distress.  Skin: Skin is warm and dry. No rash noted.   Cardiovascular: Normal heart rate noted  Respiratory: Normal respiratory effort, no problems with respiration noted  Abdomen: Soft, gravid, appropriate for gestational age.  Pain/Pressure: Absent     Pelvic: Cervical exam deferred        Extremities: Normal range of motion.  Edema: None  Mental Status: Normal mood and affect. Normal behavior. Normal judgment and thought content.   Assessment and Plan:  Pregnancy: G2P0010 at [redacted]w[redacted]d 1. Encounter for supervision of other normal pregnancy in2nd trimester Feeling well today Discussed thinking about birth ideas and engaging in [place] education. Patient asking about waterbirth. Discussed process with taking class and meeting with midwife. She will look  into this. Discussed some of the contraindications as well.   2. Single umbilical artery Needs growth [place] in 4 weeks, scheduled  3. Rh negative state in antepartum period Rhogam at next visit  4. Rubella non-immune status, antepartum MMR postpartum  Preterm labor symptoms and general obstetric precautions including but not limited to vaginal bleeding, contractions, leaking of fluid and fetal movement were reviewed in detail with the patient. Please refer to After Visit Summary for other counseling recommendations.   Return in about 4 weeks (around 09/05/2021) for Routine prenatal care, 28 wk labs.  Future Appointments  Date Time Provider [place]  09/05/2021  9:35 AM Shelly Bombard, MD [place] None  09/11/2021  3:30 PM [facility] NURSE [facility]  09/11/2021  3:45 PM [facility] US5 [facility] [place]    Caren Macadam, MD

## 2021-08-08 NOTE — Progress Notes (Signed)
Pt reports fetal movement, denies pain.  

## 2021-09-05 ENCOUNTER — Ambulatory Visit (INDEPENDENT_AMBULATORY_CARE_PROVIDER_SITE_OTHER): Payer: Medicaid Other | Admitting: Obstetrics

## 2021-09-05 ENCOUNTER — Encounter: Payer: Self-pay | Admitting: Obstetrics

## 2021-09-05 ENCOUNTER — Ambulatory Visit (INDEPENDENT_AMBULATORY_CARE_PROVIDER_SITE_OTHER): Payer: Medicaid Other | Admitting: Licensed Clinical Social Worker

## 2021-09-05 ENCOUNTER — Other Ambulatory Visit: Payer: Self-pay

## 2021-09-05 VITALS — BP 122/74 | HR 76 | Wt 190.0 lb

## 2021-09-05 DIAGNOSIS — Z3A27 27 weeks gestation of pregnancy: Secondary | ICD-10-CM

## 2021-09-05 DIAGNOSIS — O99340 Other mental disorders complicating pregnancy, unspecified trimester: Secondary | ICD-10-CM | POA: Diagnosis not present

## 2021-09-05 DIAGNOSIS — O26899 Other specified pregnancy related conditions, unspecified trimester: Secondary | ICD-10-CM

## 2021-09-05 DIAGNOSIS — O26892 Other specified pregnancy related conditions, second trimester: Secondary | ICD-10-CM

## 2021-09-05 DIAGNOSIS — O09899 Supervision of other high risk pregnancies, unspecified trimester: Secondary | ICD-10-CM

## 2021-09-05 DIAGNOSIS — Z6791 Unspecified blood type, Rh negative: Secondary | ICD-10-CM

## 2021-09-05 DIAGNOSIS — Q270 Congenital absence and hypoplasia of umbilical artery: Secondary | ICD-10-CM

## 2021-09-05 DIAGNOSIS — F419 Anxiety disorder, unspecified: Secondary | ICD-10-CM | POA: Diagnosis not present

## 2021-09-05 DIAGNOSIS — Z2839 Other underimmunization status: Secondary | ICD-10-CM

## 2021-09-05 DIAGNOSIS — O09892 Supervision of other high risk pregnancies, second trimester: Secondary | ICD-10-CM

## 2021-09-05 DIAGNOSIS — Z23 Encounter for immunization: Secondary | ICD-10-CM | POA: Diagnosis not present

## 2021-09-05 DIAGNOSIS — O360921 Maternal care for other rhesus isoimmunization, second trimester, fetus 1: Secondary | ICD-10-CM

## 2021-09-05 DIAGNOSIS — Z3480 Encounter for supervision of other normal pregnancy, unspecified trimester: Secondary | ICD-10-CM

## 2021-09-05 MED ORDER — RHO D IMMUNE GLOBULIN 1500 UNIT/2ML IJ SOSY
300.0000 ug | PREFILLED_SYRINGE | Freq: Once | INTRAMUSCULAR | Status: AC
  Administered 2021-09-05: 300 ug via INTRAMUSCULAR

## 2021-09-05 NOTE — Progress Notes (Signed)
Patient present for ROB.

## 2021-09-05 NOTE — Progress Notes (Addendum)
OB, she ate this Morning will need GTT rescheduled. C/o peeing on herself if she laugh, cough, or sneeze very hard.  TDAP/FLU/RHO given today.  PHQ-9=14 she already has an appointment to see Sue Lush Today at 11:00 am.  Administrations This Visit     rho (d) immune globulin (RHIG/RHOPHYLAC) injection 300 mcg     Admin Date 09/05/2021 Action Given Dose 300 mcg Route Intramuscular Administered By Maretta Bees, RMA

## 2021-09-05 NOTE — BH Specialist Note (Signed)
Integrated Behavioral Health via Telemedicine Visit  09/05/2021 Kara Castillo [id number]  Number of Integrated Behavioral Health visits: 3 Session Start time: 11:00am  Session End time: 11:32am Total time: 32 mins via phone per pt request   Referring Provider: Aron Baba MD  Patient/Family location: Home  [facility] Provider location: [facility]  All persons participating in visit: Pt Kara Castillo and LCSW A.S[place] Monds  Types of Service: Individual psychotherapy  I connected with Kara Castillo and/or Kara Castillo n/a via  Telephone or Video Enabled Telemedicine Application  (Video is Caregility application) and verified that I am speaking with the correct person using two identifiers. Discussed confidentiality: Yes   I discussed the limitations of telemedicine and the availability of in person appointments.  Discussed there is a possibility of technology failure and discussed alternative modes of communication if that failure occurs.  I discussed that engaging in this telemedicine visit, they consent to the provision of behavioral healthcare and the services will be billed under their insurance.  Patient and/or legal guardian expressed understanding and consented to Telemedicine visit: Yes   Presenting Concerns: Patient and/or family reports the following symptoms/concerns: anxious mood and stress  Duration of problem: approx 3 months ; Severity of problem: mild  Patient and/or Family's Strengths/Protective Factors: Concrete supports in place (healthy food, safe environments, etc.)  Goals Addressed: Patient will:  Reduce symptoms of: anxiety and stress   Increase knowledge and/or ability of: coping skills and healthy habits   Demonstrate ability to: Increase healthy adjustment to current life circumstances  Progress towards Goals: Ongoing  Interventions: Interventions utilized:  Supportive Counseling Standardized Assessments completed: PHQ 9  Patient and/or Family Response: Kara Castillo  reports increase stress due to family obligations. Kara Castillo helps her mother with raising younger siblings and rely too much on according to Kara Castillo. Kara Castillo is concern she will not be fully present for her child due to the amount of stress with family   Assessment: Patient currently experiencing anxiety affecting pregnancy.   Patient may benefit from integrated behavioral health.  Plan: Follow up with behavioral health clinician on : 09/19/2021 Behavioral recommendations: Ease back from family obligations not serving you, prioritize rest, decline offers to help others to prevent burnout and engage in self care  Referral(s): Integrated [employer] (In Clinic)  I discussed the assessment and treatment plan with the patient and/or parent/guardian. They were provided an opportunity to ask questions and all were answered. They agreed with the plan and demonstrated an understanding of the instructions.   They were advised to call back or seek an in-person evaluation if the symptoms worsen or if the condition fails to improve as anticipated.  Kara Saxon, LCSW

## 2021-09-05 NOTE — Progress Notes (Signed)
Subjective:  Kara Castillo is a 25 y.o. G2P0010 at [redacted]w[redacted]d being seen today for ongoing prenatal care.  She is currently monitored for the following issues for this low-risk pregnancy and has Major depressive disorder, recurrent episode, moderate (HCC); Anxiety; Hirsutism; Supervision of other normal pregnancy, antepartum; Rubella non-immune status, antepartum; Pap smear abnormality of cervix with LGSIL; Vomiting during pregnancy; Iron deficiency; Single umbilical artery; and Rh negative state in antepartum period on their problem list.  Patient reports heartburn.  Contractions: Not present. Vag. Bleeding: None.  Movement: Present. Denies leaking of fluid.   The following portions of the patient's history were reviewed and updated as appropriate: allergies, current medications, past family history, past medical history, past social history, past surgical history and problem list. Problem list updated.  Objective:   Vitals:   09/05/21 1005  BP: 122/74  Pulse: 76  Weight: 190 lb (86.2 kg)    Fetal Status: Fetal Heart Rate (bpm): 151   Movement: Present     General:  Alert, oriented and cooperative. Patient is in no acute distress.  Skin: Skin is warm and dry. No rash noted.   Cardiovascular: Normal heart rate noted  Respiratory: Normal respiratory effort, no problems with respiration noted  Abdomen: Soft, gravid, appropriate for gestational age. Pain/Pressure: Absent     Pelvic:  Cervical exam deferred        Extremities: Normal range of motion.  Edema: None  Mental Status: Normal mood and affect. Normal behavior. Normal judgment and thought content.   Urinalysis:      Assessment and Plan:  Pregnancy: G2P0010 at [redacted]w[redacted]d  1. Supervision of other normal pregnancy, antepartum  2. Single umbilical artery  3. Rh negative state in antepartum period - Rhogam at 28 weeks and postpartum  4. Rubella non-immune status, antepartum - Rubella vaccination postpartum    There are no diagnoses  linked to this encounter. Preterm labor symptoms and general obstetric precautions including but not limited to vaginal bleeding, contractions, leaking of fluid and fetal movement were reviewed in detail with the patient. Please refer to After Visit Summary for other counseling recommendations.   Return in about 2 weeks (around 09/19/2021) for ROB.   Brock Bad, MD  09/05/21

## 2021-09-08 ENCOUNTER — Encounter: Payer: Self-pay | Admitting: Obstetrics

## 2021-09-11 ENCOUNTER — Ambulatory Visit: Payer: Medicaid Other | Admitting: *Deleted

## 2021-09-11 ENCOUNTER — Other Ambulatory Visit: Payer: Self-pay

## 2021-09-11 ENCOUNTER — Ambulatory Visit: Payer: Medicaid Other | Attending: Maternal & Fetal Medicine

## 2021-09-11 VITALS — BP 121/63 | HR 78

## 2021-09-11 DIAGNOSIS — Z6791 Unspecified blood type, Rh negative: Secondary | ICD-10-CM | POA: Diagnosis present

## 2021-09-11 DIAGNOSIS — Z3A28 28 weeks gestation of pregnancy: Secondary | ICD-10-CM

## 2021-09-11 DIAGNOSIS — O3503X0 Maternal care for (suspected) central nervous system malformation or damage in fetus, choroid plexus cysts, not applicable or unspecified: Secondary | ICD-10-CM | POA: Diagnosis not present

## 2021-09-11 DIAGNOSIS — Z3480 Encounter for supervision of other normal pregnancy, unspecified trimester: Secondary | ICD-10-CM | POA: Diagnosis present

## 2021-09-11 DIAGNOSIS — O09893 Supervision of other high risk pregnancies, third trimester: Secondary | ICD-10-CM | POA: Diagnosis not present

## 2021-09-11 DIAGNOSIS — O09899 Supervision of other high risk pregnancies, unspecified trimester: Secondary | ICD-10-CM | POA: Diagnosis present

## 2021-09-11 DIAGNOSIS — O26899 Other specified pregnancy related conditions, unspecified trimester: Secondary | ICD-10-CM | POA: Diagnosis present

## 2021-09-12 ENCOUNTER — Other Ambulatory Visit: Payer: Self-pay | Admitting: *Deleted

## 2021-09-12 DIAGNOSIS — O3503X0 Maternal care for (suspected) central nervous system malformation or damage in fetus, choroid plexus cysts, not applicable or unspecified: Secondary | ICD-10-CM

## 2021-09-12 DIAGNOSIS — O09899 Supervision of other high risk pregnancies, unspecified trimester: Secondary | ICD-10-CM

## 2021-09-18 ENCOUNTER — Other Ambulatory Visit: Payer: Medicaid Other

## 2021-09-18 ENCOUNTER — Other Ambulatory Visit: Payer: Self-pay

## 2021-09-18 DIAGNOSIS — Z3480 Encounter for supervision of other normal pregnancy, unspecified trimester: Secondary | ICD-10-CM

## 2021-09-19 ENCOUNTER — Encounter: Payer: Medicaid Other | Admitting: Obstetrics

## 2021-09-19 ENCOUNTER — Institutional Professional Consult (permissible substitution): Payer: Medicaid Other | Admitting: Licensed Clinical Social Worker

## 2021-09-19 ENCOUNTER — Telehealth: Payer: Self-pay | Admitting: Obstetrics

## 2021-09-19 LAB — GLUCOSE TOLERANCE, 2 HOURS W/ 1HR
Glucose, 1 hour: 116 mg/dL (ref 70–179)
Glucose, 2 hour: 90 mg/dL (ref 70–152)
Glucose, Fasting: 73 mg/dL (ref 70–91)

## 2021-09-19 LAB — CBC
Hematocrit: 36.5 % (ref 34.0–46.6)
Hemoglobin: 12.5 g/dL (ref 11.1–15.9)
MCH: 33.2 pg — ABNORMAL HIGH (ref 26.6–33.0)
MCHC: 34.2 g/dL (ref 31.5–35.7)
MCV: 97 fL (ref 79–97)
Platelets: 478 10*3/uL — ABNORMAL HIGH (ref 150–450)
RBC: 3.76 x10E6/uL — ABNORMAL LOW (ref 3.77–5.28)
RDW: 11.4 % — ABNORMAL LOW (ref 11.7–15.4)
WBC: 12.8 10*3/uL — ABNORMAL HIGH (ref 3.4–10.8)

## 2021-09-19 LAB — RPR: RPR Ser Ql: NONREACTIVE

## 2021-09-19 LAB — HIV ANTIBODY (ROUTINE TESTING W REFLEX): HIV Screen 4th Generation wRfx: NONREACTIVE

## 2021-09-23 ENCOUNTER — Encounter: Payer: Medicaid Other | Admitting: Licensed Clinical Social Worker

## 2021-09-25 ENCOUNTER — Ambulatory Visit (INDEPENDENT_AMBULATORY_CARE_PROVIDER_SITE_OTHER): Payer: Medicaid Other | Admitting: Obstetrics

## 2021-09-25 ENCOUNTER — Other Ambulatory Visit: Payer: Self-pay

## 2021-09-25 ENCOUNTER — Encounter: Payer: Self-pay | Admitting: Obstetrics

## 2021-09-25 VITALS — BP 121/71 | HR 69 | Wt 192.0 lb

## 2021-09-25 DIAGNOSIS — Q270 Congenital absence and hypoplasia of umbilical artery: Secondary | ICD-10-CM

## 2021-09-25 DIAGNOSIS — Z3480 Encounter for supervision of other normal pregnancy, unspecified trimester: Secondary | ICD-10-CM

## 2021-09-25 DIAGNOSIS — O26843 Uterine size-date discrepancy, third trimester: Secondary | ICD-10-CM

## 2021-09-25 DIAGNOSIS — O26899 Other specified pregnancy related conditions, unspecified trimester: Secondary | ICD-10-CM

## 2021-09-25 DIAGNOSIS — Z6791 Unspecified blood type, Rh negative: Secondary | ICD-10-CM

## 2021-09-25 NOTE — Progress Notes (Signed)
Subjective:  Kara Castillo is a 25 y.o. G2P0010 at [redacted]w[redacted]d being seen today for ongoing prenatal care.  She is currently monitored for the following issues for this low-risk pregnancy and has Major depressive disorder, recurrent episode, moderate (HCC); Anxiety; Hirsutism; Supervision of other normal pregnancy, antepartum; Rubella non-immune status, antepartum; Pap smear abnormality of cervix with LGSIL; Vomiting during pregnancy; Iron deficiency; Single umbilical artery; and Rh negative state in antepartum period on their problem list.  Patient reports no complaints.  Contractions: Not present. Vag. Bleeding: None.  Movement: Present. Denies leaking of fluid.   The following portions of the patient's history were reviewed and updated as appropriate: allergies, current medications, past family history, past medical history, past social history, past surgical history and problem list. Problem list updated.  Objective:   Vitals:   09/25/21 0858  BP: 121/71  Pulse: 69  Weight: 192 lb (87.1 kg)    Fetal Status: Fetal Heart Rate (bpm): 145   Movement: Present     General:  Alert, oriented and cooperative. Patient is in no acute distress.  Skin: Skin is warm and dry. No rash noted.   Cardiovascular: Normal heart rate noted  Respiratory: Normal respiratory effort, no problems with respiration noted  Abdomen: Soft, gravid, appropriate for gestational age. Pain/Pressure: Absent     Pelvic:  Cervical exam deferred        Extremities: Normal range of motion.  Edema: None  Mental Status: Normal mood and affect. Normal behavior. Normal judgment and thought content.   Urinalysis:      Assessment and Plan:  Pregnancy: G2P0010 at [redacted]w[redacted]d  1. Supervision of other normal pregnancy, antepartum  2. Single umbilical artery - followed by MFM  3. Rh negative state in antepartum period - Rhogam postpartum  4. Size of fetus inconsistent with dates in third trimester Rx: - [place] MFM OB FOLLOW UP;  Future  Preterm labor symptoms and general obstetric precautions including but not limited to vaginal bleeding, contractions, leaking of fluid and fetal movement were reviewed in detail with the patient. Please refer to After Visit Summary for other counseling recommendations.   Return in about 2 weeks (around 10/09/2021) for ROB.   Brock Bad, MD  09/25/21

## 2021-10-07 ENCOUNTER — Other Ambulatory Visit: Payer: Self-pay

## 2021-10-07 ENCOUNTER — Ambulatory Visit (INDEPENDENT_AMBULATORY_CARE_PROVIDER_SITE_OTHER): Payer: Medicaid Other | Admitting: Advanced Practice Midwife

## 2021-10-07 VITALS — BP 126/74 | HR 79 | Wt 195.6 lb

## 2021-10-07 DIAGNOSIS — Z3480 Encounter for supervision of other normal pregnancy, unspecified trimester: Secondary | ICD-10-CM

## 2021-10-07 DIAGNOSIS — F419 Anxiety disorder, unspecified: Secondary | ICD-10-CM

## 2021-10-07 DIAGNOSIS — O99340 Other mental disorders complicating pregnancy, unspecified trimester: Secondary | ICD-10-CM

## 2021-10-07 DIAGNOSIS — Z3A32 32 weeks gestation of pregnancy: Secondary | ICD-10-CM

## 2021-10-07 DIAGNOSIS — Q270 Congenital absence and hypoplasia of umbilical artery: Secondary | ICD-10-CM

## 2021-10-07 DIAGNOSIS — Z6791 Unspecified blood type, Rh negative: Secondary | ICD-10-CM

## 2021-10-07 DIAGNOSIS — O26899 Other specified pregnancy related conditions, unspecified trimester: Secondary | ICD-10-CM

## 2021-10-07 NOTE — Progress Notes (Signed)
   PRENATAL VISIT NOTE  Subjective:  Kara Castillo is a 25 y.o. G2P0010 at [redacted]w[redacted]d being seen today for ongoing prenatal care.  She is currently monitored for the following issues for this low-risk pregnancy and has Major depressive disorder, recurrent episode, moderate (HCC); Anxiety; Hirsutism; Supervision of other normal pregnancy, antepartum; Rubella non-immune status, antepartum; Pap smear abnormality of cervix with LGSIL; Vomiting during pregnancy; Iron deficiency; Single umbilical artery; and Rh negative state in antepartum period on their problem list.  Patient reports no complaints.  Contractions: Not present. Vag. Bleeding: None.  Movement: Present. Denies leaking of fluid.   The following portions of the patient's history were reviewed and updated as appropriate: allergies, current medications, past family history, past medical history, past social history, past surgical history and problem list.   Objective:   Vitals:   10/07/21 1340  BP: 126/74  Pulse: 79  Weight: 195 lb 9.6 oz (88.7 kg)    Fetal Status: Fetal Heart Rate (bpm): 145   Movement: Present     General:  Alert, oriented and cooperative. Patient is in no acute distress.  Skin: Skin is warm and dry. No rash noted.   Cardiovascular: Normal heart rate noted  Respiratory: Normal respiratory effort, no problems with respiration noted  Abdomen: Soft, gravid, appropriate for gestational age.  Pain/Pressure: Present     Pelvic: Cervical exam deferred        Extremities: Normal range of motion.  Edema: None  Mental Status: Normal mood and affect. Normal behavior. Normal judgment and thought content.   Assessment and Plan:  Pregnancy: G2P0010 at [redacted]w[redacted]d 1. Supervision of other normal pregnancy, antepartum --Anticipatory guidance about next visits/weeks of pregnancy given. --Next visit in 2 weeks  2. Single umbilical artery --Growth US wnl  3. Rh negative state in antepartum period --Rhophylac given 10/7  4. Anxiety  disorder affecting pregnancy, antepartum --Visit with [name], LCSW, with integrated BH on 10/7 --Doing well, to notify office if she desires to see IBH or other services  5. [redacted] weeks gestation of pregnancy   Preterm labor symptoms and general obstetric precautions including but not limited to vaginal bleeding, contractions, leaking of fluid and fetal movement were reviewed in detail with the patient. Please refer to After Visit Summary for other counseling recommendations.   No follow-ups on file.  Future Appointments  Date Time Provider Department Center  10/09/2021  7:30 AM [facility] NURSE [facility]  10/09/2021  7:30 AM [facility] US3 [facility]    Sharen Counter, CNM

## 2021-10-07 NOTE — Progress Notes (Signed)
Patient presents for ROB. Patient has no concerns today. 

## 2021-10-09 ENCOUNTER — Ambulatory Visit: Payer: Medicaid Other | Admitting: *Deleted

## 2021-10-09 ENCOUNTER — Other Ambulatory Visit: Payer: Self-pay | Admitting: *Deleted

## 2021-10-09 ENCOUNTER — Other Ambulatory Visit: Payer: Self-pay

## 2021-10-09 ENCOUNTER — Ambulatory Visit: Payer: Medicaid Other | Attending: Obstetrics

## 2021-10-09 VITALS — BP 125/78 | HR 96

## 2021-10-09 DIAGNOSIS — O09899 Supervision of other high risk pregnancies, unspecified trimester: Secondary | ICD-10-CM | POA: Diagnosis present

## 2021-10-09 DIAGNOSIS — O09893 Supervision of other high risk pregnancies, third trimester: Secondary | ICD-10-CM

## 2021-10-09 DIAGNOSIS — Z6791 Unspecified blood type, Rh negative: Secondary | ICD-10-CM | POA: Diagnosis present

## 2021-10-09 DIAGNOSIS — Z3A32 32 weeks gestation of pregnancy: Secondary | ICD-10-CM

## 2021-10-09 DIAGNOSIS — Z3480 Encounter for supervision of other normal pregnancy, unspecified trimester: Secondary | ICD-10-CM | POA: Diagnosis present

## 2021-10-09 DIAGNOSIS — O26899 Other specified pregnancy related conditions, unspecified trimester: Secondary | ICD-10-CM

## 2021-10-09 DIAGNOSIS — O3503X0 Maternal care for (suspected) central nervous system malformation or damage in fetus, choroid plexus cysts, not applicable or unspecified: Secondary | ICD-10-CM | POA: Diagnosis not present

## 2021-10-21 ENCOUNTER — Ambulatory Visit (INDEPENDENT_AMBULATORY_CARE_PROVIDER_SITE_OTHER): Payer: Medicaid Other | Admitting: Advanced Practice Midwife

## 2021-10-21 ENCOUNTER — Other Ambulatory Visit: Payer: Self-pay

## 2021-10-21 VITALS — BP 139/81 | HR 101 | Wt 204.0 lb

## 2021-10-21 DIAGNOSIS — O26899 Other specified pregnancy related conditions, unspecified trimester: Secondary | ICD-10-CM

## 2021-10-21 DIAGNOSIS — Z3A34 34 weeks gestation of pregnancy: Secondary | ICD-10-CM

## 2021-10-21 DIAGNOSIS — Q270 Congenital absence and hypoplasia of umbilical artery: Secondary | ICD-10-CM

## 2021-10-21 DIAGNOSIS — Z6791 Unspecified blood type, Rh negative: Secondary | ICD-10-CM

## 2021-10-21 DIAGNOSIS — Z3480 Encounter for supervision of other normal pregnancy, unspecified trimester: Secondary | ICD-10-CM

## 2021-10-21 NOTE — Progress Notes (Signed)
   PRENATAL VISIT NOTE  Subjective:  Kara Castillo is a 25 y.o. G2P0010 at [redacted]w[redacted]d being seen today for ongoing prenatal care.  She is currently monitored for the following issues for this low-risk pregnancy and has Major depressive disorder, recurrent episode, moderate (HCC); Anxiety; Hirsutism; Supervision of other normal pregnancy, antepartum; Rubella non-immune status, antepartum; Pap smear abnormality of cervix with LGSIL; Vomiting during pregnancy; Iron deficiency; Single umbilical artery; and Rh negative state in antepartum period on their problem list.  Patient reports no complaints.  Contractions: Not present. Vag. Bleeding: None.  Movement: Present. Denies leaking of fluid.   The following portions of the patient's history were reviewed and updated as appropriate: allergies, current medications, past family history, past medical history, past social history, past surgical history and problem list.   Objective:   Vitals:   10/21/21 1354  BP: 139/81  Pulse: (!) 101  Weight: 204 lb (92.5 kg)    Fetal Status: Fetal Heart Rate (bpm): 159   Movement: Present     General:  Alert, oriented and cooperative. Patient is in no acute distress.  Skin: Skin is warm and dry. No rash noted.   Cardiovascular: Normal heart rate noted  Respiratory: Normal respiratory effort, no problems with respiration noted  Abdomen: Soft, gravid, appropriate for gestational age.  Pain/Pressure: Absent     Pelvic: Cervical exam deferred        Extremities: Normal range of motion.  Edema: Trace  Mental Status: Normal mood and affect. Normal behavior. Normal judgment and thought content.   Assessment and Plan:  Pregnancy: G2P0010 at [redacted]w[redacted]d 1. Supervision of other normal pregnancy, antepartum --Anticipatory guidance about next visits/weeks of pregnancy given. --Next appt in 2 weeks  2. Single umbilical artery --Growth wnl  3. Rh negative state in antepartum period  4. [redacted] weeks gestation of  pregnancy   Preterm labor symptoms and general obstetric precautions including but not limited to vaginal bleeding, contractions, leaking of fluid and fetal movement were reviewed in detail with the patient. Please refer to After Visit Summary for other counseling recommendations.   No follow-ups on file.  Future Appointments  Date Time Provider Department Center  11/05/2021  9:55 AM Nugent, Odie Sera, NP [facility] None  11/06/2021  7:15 AM [facility] NURSE [facility]  11/06/2021  7:30 AM [facility] US3 [facility]    Sharen Counter, CNM

## 2021-11-05 ENCOUNTER — Ambulatory Visit (INDEPENDENT_AMBULATORY_CARE_PROVIDER_SITE_OTHER): Payer: Medicaid Other | Admitting: Family Medicine

## 2021-11-05 ENCOUNTER — Encounter: Payer: Self-pay | Admitting: Family Medicine

## 2021-11-05 ENCOUNTER — Other Ambulatory Visit: Payer: Self-pay

## 2021-11-05 ENCOUNTER — Other Ambulatory Visit (HOSPITAL_COMMUNITY)
Admission: RE | Admit: 2021-11-05 | Discharge: 2021-11-05 | Disposition: A | Discharge status: Home / Self Care | Payer: Medicaid Other | Source: Ambulatory Visit | Attending: Women's Health | Admitting: Women's Health

## 2021-11-05 VITALS — BP 129/74 | HR 83 | Wt 210.0 lb

## 2021-11-05 DIAGNOSIS — O26899 Other specified pregnancy related conditions, unspecified trimester: Secondary | ICD-10-CM

## 2021-11-05 DIAGNOSIS — Z2839 Other underimmunization status: Secondary | ICD-10-CM

## 2021-11-05 DIAGNOSIS — Z3480 Encounter for supervision of other normal pregnancy, unspecified trimester: Secondary | ICD-10-CM | POA: Diagnosis present

## 2021-11-05 DIAGNOSIS — Z3A36 36 weeks gestation of pregnancy: Secondary | ICD-10-CM | POA: Diagnosis present

## 2021-11-05 DIAGNOSIS — Q270 Congenital absence and hypoplasia of umbilical artery: Secondary | ICD-10-CM

## 2021-11-05 DIAGNOSIS — O09899 Supervision of other high risk pregnancies, unspecified trimester: Secondary | ICD-10-CM

## 2021-11-05 DIAGNOSIS — Z6791 Unspecified blood type, Rh negative: Secondary | ICD-10-CM

## 2021-11-05 NOTE — Progress Notes (Signed)
   PRENATAL VISIT NOTE  Subjective:  Kara Castillo is a 25 y.o. G2P0010 at [redacted]w[redacted]d being seen today for ongoing prenatal care.  She is currently monitored for the following issues for this low-risk pregnancy and has Major depressive disorder, recurrent episode, moderate ([place]); Anxiety; Hirsutism; Supervision of other normal pregnancy, antepartum; Rubella non-immune status, antepartum; Pap smear abnormality of cervix with LGSIL; Vomiting during pregnancy; Iron deficiency; Single umbilical artery; and Rh negative state in antepartum period on their problem list.  Patient reports no complaints. She reports some intermittent pelvic pressure but this is not painful. Contractions: Not present. Vag. Bleeding: None.  Movement: Present. Denies leaking of fluid.   The following portions of the patient's history were reviewed and updated as appropriate: allergies, current medications, past family history, past medical history, past social history, past surgical history and problem list.   Objective:   Vitals:   11/05/21 1022  BP: 129/74  Pulse: 83  Weight: 210 lb (95.3 kg)    Fetal Status: Fetal Heart Rate (bpm): 145 Fundal Height: 36 cm Movement: Present  General:  Alert, oriented and cooperative. Patient is in no acute distress.  Skin: Skin is warm and dry. No rash noted.   Cardiovascular: Normal heart rate noted.  Respiratory: Normal respiratory effort, no problems with respiration noted.  Abdomen: Soft, gravid, appropriate for gestational age.   Pelvic: Cervical exam deferred.  Extremities: Normal range of motion.  Edema: None.  Mental Status: Normal mood and affect. Normal behavior. Normal judgment and thought content.   Assessment and Plan:  Pregnancy: G2P0010 at [redacted]w[redacted]d  1. Supervision of other normal pregnancy, antepartum 2. [redacted] weeks gestation of pregnancy Progressing well. FH and FHT within normal limits. Vaginal swabs obtained today as below. Will follow up results. Provided reassurance  about intermittent pelvic pressure. Labor signs/symptoms reviewed. Will follow up in 1 week for next prenatal visit.  - Cervicovaginal ancillary only( [facility]) - Strep Gp B Culture+Rflx  3. Single umbilical artery Continues to have normal fetal growth on [place]. Follow up US tomorrow. Will continue to follow results and MFM recommendations.   4. Rh negative state in antepartum period S/p Rhogam on 09/05/21. Plan for Rhogam evaluation postpartum.   5. Rubella non-immune status, antepartum Plan for MMR postpartum.  Preterm labor symptoms and general obstetric precautions including but not limited to vaginal bleeding, contractions, leaking of fluid and fetal movement were reviewed in detail with the patient.  Please refer to After Visit Summary for other counseling recommendations.   Return in about 1 week (around 11/12/2021) for follow up LR OB visit.  Future Appointments  Date Time Provider [place]  11/06/2021  7:15 AM [facility] NURSE [facility]  11/06/2021  7:30 AM [facility] US3 [facility]  11/12/2021  2:50 PM Shelly Bombard, MD [facility] None    Genia Del, MD

## 2021-11-06 ENCOUNTER — Encounter: Payer: Self-pay | Admitting: *Deleted

## 2021-11-06 ENCOUNTER — Ambulatory Visit: Payer: Medicaid Other | Attending: Obstetrics and Gynecology

## 2021-11-06 ENCOUNTER — Ambulatory Visit: Payer: Medicaid Other | Admitting: *Deleted

## 2021-11-06 VITALS — BP 132/77 | HR 75

## 2021-11-06 DIAGNOSIS — Z362 Encounter for other antenatal screening follow-up: Secondary | ICD-10-CM

## 2021-11-06 DIAGNOSIS — O09893 Supervision of other high risk pregnancies, third trimester: Secondary | ICD-10-CM | POA: Diagnosis not present

## 2021-11-06 DIAGNOSIS — Z6791 Unspecified blood type, Rh negative: Secondary | ICD-10-CM | POA: Diagnosis present

## 2021-11-06 DIAGNOSIS — O358XX0 Maternal care for other (suspected) fetal abnormality and damage, not applicable or unspecified: Secondary | ICD-10-CM | POA: Diagnosis not present

## 2021-11-06 DIAGNOSIS — Z3480 Encounter for supervision of other normal pregnancy, unspecified trimester: Secondary | ICD-10-CM | POA: Insufficient documentation

## 2021-11-06 DIAGNOSIS — O09899 Supervision of other high risk pregnancies, unspecified trimester: Secondary | ICD-10-CM | POA: Insufficient documentation

## 2021-11-06 DIAGNOSIS — O26899 Other specified pregnancy related conditions, unspecified trimester: Secondary | ICD-10-CM

## 2021-11-06 DIAGNOSIS — Z3A36 36 weeks gestation of pregnancy: Secondary | ICD-10-CM

## 2021-11-06 LAB — CERVICOVAGINAL ANCILLARY ONLY
Chlamydia: NEGATIVE
Comment: NEGATIVE
Comment: NEGATIVE
Comment: NORMAL
Neisseria Gonorrhea: NEGATIVE
Trichomonas: NEGATIVE

## 2021-11-09 LAB — STREP GP B CULTURE+RFLX: Strep Gp B Culture+Rflx: POSITIVE — AB

## 2021-11-09 LAB — STREP GP B SUSCEPTIBILITY

## 2021-11-12 ENCOUNTER — Encounter: Payer: Self-pay | Admitting: Obstetrics

## 2021-11-12 ENCOUNTER — Other Ambulatory Visit: Payer: Self-pay

## 2021-11-12 ENCOUNTER — Ambulatory Visit (INDEPENDENT_AMBULATORY_CARE_PROVIDER_SITE_OTHER): Payer: Medicaid Other | Admitting: Obstetrics

## 2021-11-12 VITALS — BP 127/81 | HR 78 | Wt 210.0 lb

## 2021-11-12 DIAGNOSIS — Z6791 Unspecified blood type, Rh negative: Secondary | ICD-10-CM

## 2021-11-12 DIAGNOSIS — O09899 Supervision of other high risk pregnancies, unspecified trimester: Secondary | ICD-10-CM

## 2021-11-12 DIAGNOSIS — O26899 Other specified pregnancy related conditions, unspecified trimester: Secondary | ICD-10-CM

## 2021-11-12 DIAGNOSIS — Z3480 Encounter for supervision of other normal pregnancy, unspecified trimester: Secondary | ICD-10-CM

## 2021-11-12 DIAGNOSIS — Z2839 Other underimmunization status: Secondary | ICD-10-CM

## 2021-11-12 DIAGNOSIS — Q270 Congenital absence and hypoplasia of umbilical artery: Secondary | ICD-10-CM

## 2021-11-12 NOTE — Progress Notes (Signed)
Subjective:  Kara Castillo is a 25 y.o. G2P0010 at [redacted]w[redacted]d being seen today for ongoing prenatal care.  She is currently monitored for the following issues for this low-risk pregnancy and has Major depressive disorder, recurrent episode, moderate (HCC); Anxiety; Hirsutism; Supervision of other normal pregnancy, antepartum; Rubella non-immune status, antepartum; Pap smear abnormality of cervix with LGSIL; Vomiting during pregnancy; Iron deficiency; Single umbilical artery; and Rh negative state in antepartum period on their problem list.  Patient reports no complaints.  Contractions: Not present. Vag. Bleeding: None.  Movement: Present. Denies leaking of fluid.   The following portions of the patient's history were reviewed and updated as appropriate: allergies, current medications, past family history, past medical history, past social history, past surgical history and problem list. Problem list updated.  Objective:   Vitals:   11/12/21 1511  BP: 127/81  Pulse: 78  Weight: 210 lb (95.3 kg)    Fetal Status: Fetal Heart Rate (bpm): 141   Movement: Present     General:  Alert, oriented and cooperative. Patient is in no acute distress.  Skin: Skin is warm and dry. No rash noted.   Cardiovascular: Normal heart rate noted  Respiratory: Normal respiratory effort, no problems with respiration noted  Abdomen: Soft, gravid, appropriate for gestational age. Pain/Pressure: Present     Pelvic:  Cervical exam deferred        Extremities: Normal range of motion.  Edema: Trace  Mental Status: Normal mood and affect. Normal behavior. Normal judgment and thought content.   Urinalysis:      Assessment and Plan:  Pregnancy: G2P0010 at [redacted]w[redacted]d  1. Supervision of other normal pregnancy, antepartum  2. Single umbilical artery - low risk NIPS - growth WNL's  3. Rh negative state in antepartum period - Rhogam postpartum  4. Rubella non-immune status, antepartum - rubella vaccination postpartum     There are no diagnoses linked to this encounter. Term labor symptoms and general obstetric precautions including but not limited to vaginal bleeding, contractions, leaking of fluid and fetal movement were reviewed in detail with the patient. Please refer to After Visit Summary for other counseling recommendations.   Return in about 1 week (around 11/19/2021) for ROB.   Brock Bad, MD  11/12/21

## 2021-11-21 ENCOUNTER — Encounter: Payer: Self-pay | Admitting: Obstetrics and Gynecology

## 2021-11-21 ENCOUNTER — Ambulatory Visit (INDEPENDENT_AMBULATORY_CARE_PROVIDER_SITE_OTHER): Payer: Medicaid Other | Admitting: Obstetrics and Gynecology

## 2021-11-21 ENCOUNTER — Other Ambulatory Visit: Payer: Self-pay

## 2021-11-21 VITALS — BP 129/73 | HR 91 | Wt 213.8 lb

## 2021-11-21 DIAGNOSIS — O09899 Supervision of other high risk pregnancies, unspecified trimester: Secondary | ICD-10-CM

## 2021-11-21 DIAGNOSIS — Q270 Congenital absence and hypoplasia of umbilical artery: Secondary | ICD-10-CM

## 2021-11-21 DIAGNOSIS — Z2839 Other underimmunization status: Secondary | ICD-10-CM

## 2021-11-21 DIAGNOSIS — Z3A38 38 weeks gestation of pregnancy: Secondary | ICD-10-CM

## 2021-11-21 DIAGNOSIS — Z6791 Unspecified blood type, Rh negative: Secondary | ICD-10-CM

## 2021-11-21 DIAGNOSIS — O26899 Other specified pregnancy related conditions, unspecified trimester: Secondary | ICD-10-CM

## 2021-11-21 DIAGNOSIS — Z3480 Encounter for supervision of other normal pregnancy, unspecified trimester: Secondary | ICD-10-CM

## 2021-11-21 NOTE — Progress Notes (Signed)
Patient presents for ROB. Patient has no concerns today. 

## 2021-11-21 NOTE — Progress Notes (Signed)
° °  PRENATAL VISIT NOTE  Subjective:  Kara Castillo is a 25 y.o. G2P0010 at [redacted]w[redacted]d being seen today for ongoing prenatal care.  She is currently monitored for the following issues for this low-risk pregnancy and has Major depressive disorder, recurrent episode, moderate (HCC); Anxiety; Hirsutism; Supervision of other normal pregnancy, antepartum; Rubella non-immune status, antepartum; Pap smear abnormality of cervix with LGSIL; Vomiting during pregnancy; Iron deficiency; Single umbilical artery; Rh negative state in antepartum period; and [redacted] weeks gestation of pregnancy on their problem list.  Patient doing well with no acute concerns today. She reports no complaints.  Contractions: Irritability. Vag. Bleeding: None.  Movement: Present. Denies leaking of fluid.   The following portions of the patient's history were reviewed and updated as appropriate: allergies, current medications, past family history, past medical history, past social history, past surgical history and problem list. Problem list updated.  Objective:   Vitals:   11/21/21 1050  BP: 129/73  Pulse: 91  Weight: 213 lb 12.8 oz (97 kg)    Fetal Status: Fetal Heart Rate (bpm): 147 Fundal Height: 39 cm Movement: Present     General:  Alert, oriented and cooperative. Patient is in no acute distress.  Skin: Skin is warm and dry. No rash noted.   Cardiovascular: Normal heart rate noted  Respiratory: Normal respiratory effort, no problems with respiration noted  Abdomen: Soft, gravid, appropriate for gestational age.  Pain/Pressure: Present     Pelvic: Cervical exam performed Dilation: Fingertip Effacement (%): 60 Station: -2  Extremities: Normal range of motion.  Edema: None  Mental Status:  Normal mood and affect. Normal behavior. Normal judgment and thought content.   Assessment and Plan:  Pregnancy: G2P0010 at [redacted]w[redacted]d  1. Supervision of other normal pregnancy, antepartum Continue routine care  2. [redacted] weeks gestation of  pregnancy   3. Single umbilical artery Reviewed guidelines, no extra surveillance or early delivery  4. Rubella non-immune status, antepartum Treat after delivery  5. Rh negative state in antepartum period Rhogam after delivery  Term labor symptoms and general obstetric precautions including but not limited to vaginal bleeding, contractions, leaking of fluid and fetal movement were reviewed in detail with the patient.  Please refer to After Visit Summary for other counseling recommendations.   Return in about 1 week (around 11/28/2021) for ROB, in person.   Mariel Aloe, MD Faculty Attending Center for [facility]

## 2021-11-23 NOTE — Progress Notes (Signed)
PRENATAL VISIT NOTE  Subjective:  Kara Castillo is a 25 y.o. G2P0010 at 85w2dbeing seen today for ongoing prenatal care.  She is currently monitored for the following issues for this low-risk pregnancy and has Major depressive disorder, recurrent episode, moderate ([place]; Anxiety; Hirsutism; Supervision of other normal pregnancy, antepartum; Rubella non-immune status, antepartum; Pap smear abnormality of cervix with LGSIL; Single umbilical artery; and Rh negative state in antepartum period on their problem list.  Patient reports no complaints.  Contractions: Irritability. Vag. Bleeding: None.  Movement: Present. Denies leaking of fluid.   The following portions of the patient's history were reviewed and updated as appropriate: allergies, current medications, past family history, past medical history, past social history, past surgical history and problem list.   Objective:   Vitals:   11/25/21 0835  BP: 137/88  Pulse: 95  Weight: 214 lb 12.8 oz (97.4 kg)    Fetal Status: Fetal Heart Rate (bpm): 143 Fundal Height: 39 cm Movement: Present     General:  Alert, oriented and cooperative. Patient is in no acute distress.  Skin: Skin is warm and dry. No rash noted.   Cardiovascular: Normal heart rate noted  Respiratory: Normal respiratory effort, no problems with respiration noted  Abdomen: Soft, gravid, appropriate for gestational age.  Pain/Pressure: Present     Pelvic: Cervical exam deferred        Extremities: Normal range of motion.     Mental Status: Normal mood and affect. Normal behavior. Normal judgment and thought content.   Assessment and Plan:  Pregnancy: G2P0010 at 342w2d. Supervision of other normal pregnancy, antepartum GBS pos, clinda resistant. PCN allergy that is unknown - had it as a child.  IOL scheduled for 12/03/20  2. Rubella non-immune status, antepartum MMR after delivery  3. Rh negative state in antepartum period S/p rhogam at 28w  4. Single umbilical  artery Growth on 12/8 was normal, 31%ile  Term labor symptoms and general obstetric precautions including but not limited to vaginal bleeding, contractions, leaking of fluid and fetal movement were reviewed in detail with the patient. Please refer to After Visit Summary for other counseling recommendations.   Return in about 1 week (around 12/02/2021) for Scheduled for induction on 1/4 - can schedule postpartum visit.  Future Appointments  Date Time Provider [place]1/02/2022  7:15 AM [facility] SCHED ROOM [facility] None    PaRadene GunningMD

## 2021-11-25 ENCOUNTER — Ambulatory Visit (INDEPENDENT_AMBULATORY_CARE_PROVIDER_SITE_OTHER): Payer: Medicaid Other | Admitting: Obstetrics and Gynecology

## 2021-11-25 ENCOUNTER — Other Ambulatory Visit: Payer: Self-pay

## 2021-11-25 VITALS — BP 137/88 | HR 95 | Wt 214.8 lb

## 2021-11-25 DIAGNOSIS — Q270 Congenital absence and hypoplasia of umbilical artery: Secondary | ICD-10-CM

## 2021-11-25 DIAGNOSIS — O26899 Other specified pregnancy related conditions, unspecified trimester: Secondary | ICD-10-CM

## 2021-11-25 DIAGNOSIS — Z3480 Encounter for supervision of other normal pregnancy, unspecified trimester: Secondary | ICD-10-CM

## 2021-11-25 DIAGNOSIS — O09899 Supervision of other high risk pregnancies, unspecified trimester: Secondary | ICD-10-CM

## 2021-11-25 DIAGNOSIS — Z2839 Other underimmunization status: Secondary | ICD-10-CM

## 2021-11-25 DIAGNOSIS — Z6791 Unspecified blood type, Rh negative: Secondary | ICD-10-CM

## 2021-11-26 ENCOUNTER — Inpatient Hospital Stay (HOSPITAL_COMMUNITY)
Admission: AD | Admit: 2021-11-26 | Discharge: 2021-11-29 | DRG: 807 | Disposition: A | Discharge status: Home / Self Care | Payer: Medicaid Other | Attending: Obstetrics and Gynecology | Admitting: Obstetrics and Gynecology

## 2021-11-26 ENCOUNTER — Other Ambulatory Visit: Payer: Self-pay

## 2021-11-26 ENCOUNTER — Other Ambulatory Visit: Payer: Self-pay | Admitting: Obstetrics and Gynecology

## 2021-11-26 DIAGNOSIS — Z880 Allergy status to penicillin: Secondary | ICD-10-CM

## 2021-11-26 DIAGNOSIS — O26893 Other specified pregnancy related conditions, third trimester: Secondary | ICD-10-CM | POA: Diagnosis present

## 2021-11-26 DIAGNOSIS — O4290 Premature rupture of membranes, unspecified as to length of time between rupture and onset of labor, unspecified weeks of gestation: Secondary | ICD-10-CM | POA: Diagnosis present

## 2021-11-26 DIAGNOSIS — Z23 Encounter for immunization: Secondary | ICD-10-CM

## 2021-11-26 DIAGNOSIS — Z3480 Encounter for supervision of other normal pregnancy, unspecified trimester: Secondary | ICD-10-CM

## 2021-11-26 DIAGNOSIS — O26899 Other specified pregnancy related conditions, unspecified trimester: Secondary | ICD-10-CM

## 2021-11-26 DIAGNOSIS — Z6791 Unspecified blood type, Rh negative: Secondary | ICD-10-CM

## 2021-11-26 DIAGNOSIS — O4292 Full-term premature rupture of membranes, unspecified as to length of time between rupture and onset of labor: Principal | ICD-10-CM | POA: Diagnosis present

## 2021-11-26 DIAGNOSIS — Z20822 Contact with and (suspected) exposure to covid-19: Secondary | ICD-10-CM | POA: Diagnosis present

## 2021-11-26 DIAGNOSIS — O99824 Streptococcus B carrier state complicating childbirth: Secondary | ICD-10-CM | POA: Diagnosis present

## 2021-11-26 DIAGNOSIS — O480 Post-term pregnancy: Secondary | ICD-10-CM | POA: Diagnosis present

## 2021-11-26 DIAGNOSIS — O134 Gestational [pregnancy-induced] hypertension without significant proteinuria, complicating childbirth: Secondary | ICD-10-CM | POA: Diagnosis present

## 2021-11-26 DIAGNOSIS — Z3A39 39 weeks gestation of pregnancy: Secondary | ICD-10-CM

## 2021-11-26 NOTE — MAU Note (Signed)
About 2100 I started leaking clear fld that has now turned alittle pink. Having some contractions. No recent sve. Good FM

## 2021-11-27 ENCOUNTER — Other Ambulatory Visit: Payer: Self-pay

## 2021-11-27 ENCOUNTER — Encounter (HOSPITAL_COMMUNITY): Payer: Self-pay | Admitting: Obstetrics and Gynecology

## 2021-11-27 ENCOUNTER — Inpatient Hospital Stay (HOSPITAL_COMMUNITY): Payer: Medicaid Other | Admitting: Anesthesiology

## 2021-11-27 DIAGNOSIS — O4292 Full-term premature rupture of membranes, unspecified as to length of time between rupture and onset of labor: Secondary | ICD-10-CM | POA: Diagnosis present

## 2021-11-27 DIAGNOSIS — O4290 Premature rupture of membranes, unspecified as to length of time between rupture and onset of labor, unspecified weeks of gestation: Secondary | ICD-10-CM | POA: Diagnosis present

## 2021-11-27 DIAGNOSIS — O26893 Other specified pregnancy related conditions, third trimester: Secondary | ICD-10-CM | POA: Diagnosis present

## 2021-11-27 DIAGNOSIS — Z6791 Unspecified blood type, Rh negative: Secondary | ICD-10-CM | POA: Diagnosis not present

## 2021-11-27 DIAGNOSIS — O99824 Streptococcus B carrier state complicating childbirth: Secondary | ICD-10-CM

## 2021-11-27 DIAGNOSIS — O480 Post-term pregnancy: Secondary | ICD-10-CM

## 2021-11-27 DIAGNOSIS — O134 Gestational [pregnancy-induced] hypertension without significant proteinuria, complicating childbirth: Secondary | ICD-10-CM | POA: Diagnosis present

## 2021-11-27 DIAGNOSIS — Z3A39 39 weeks gestation of pregnancy: Secondary | ICD-10-CM

## 2021-11-27 DIAGNOSIS — O1092 Unspecified pre-existing hypertension complicating childbirth: Secondary | ICD-10-CM | POA: Diagnosis not present

## 2021-11-27 DIAGNOSIS — Z20822 Contact with and (suspected) exposure to covid-19: Secondary | ICD-10-CM | POA: Diagnosis present

## 2021-11-27 DIAGNOSIS — O326XX0 Maternal care for compound presentation, not applicable or unspecified: Secondary | ICD-10-CM

## 2021-11-27 DIAGNOSIS — Z23 Encounter for immunization: Secondary | ICD-10-CM | POA: Diagnosis not present

## 2021-11-27 DIAGNOSIS — Z880 Allergy status to penicillin: Secondary | ICD-10-CM | POA: Diagnosis not present

## 2021-11-27 LAB — COMPREHENSIVE METABOLIC PANEL
ALT: 10 U/L (ref 0–44)
AST: 23 U/L (ref 15–41)
Albumin: 2.4 g/dL — ABNORMAL LOW (ref 3.5–5.0)
Alkaline Phosphatase: 66 U/L (ref 38–126)
Anion gap: 7 (ref 5–15)
BUN: <5 mg/dL — ABNORMAL LOW (ref 6–20)
CO2: 23 mmol/L (ref 22–32)
Calcium: 8.6 mg/dL — ABNORMAL LOW (ref 8.9–10.3)
Chloride: 102 mmol/L (ref 98–111)
Creatinine, Ser: 0.76 mg/dL (ref 0.44–1.00)
GFR, Estimated: >60 mL/min (ref >60)
Glucose, Bld: 83 mg/dL (ref 70–99)
Potassium: 4.7 mmol/L (ref 3.5–5.1)
Sodium: 132 mmol/L — ABNORMAL LOW (ref 135–145)
Total Bilirubin: 0.8 mg/dL (ref 0.3–1.2)
Total Protein: 5.9 g/dL — ABNORMAL LOW (ref 6.5–8.1)

## 2021-11-27 LAB — PROTEIN / CREATININE RATIO, URINE
Creatinine, Urine: 84.57 mg/dL
Protein Creatinine Ratio: 0.11 mg/mg{Cre} (ref 0.00–0.15)
Total Protein, Urine: 9 mg/dL

## 2021-11-27 LAB — CBC
HCT: 30.9 % — ABNORMAL LOW (ref 36.0–46.0)
Hemoglobin: 10.2 g/dL — ABNORMAL LOW (ref 12.0–15.0)
MCH: 31.5 pg (ref 26.0–34.0)
MCHC: 33 g/dL (ref 30.0–36.0)
MCV: 95.4 fL (ref 80.0–100.0)
Platelets: 411 10*3/uL — ABNORMAL HIGH (ref 150–400)
RBC: 3.24 MIL/uL — ABNORMAL LOW (ref 3.87–5.11)
RDW: 12.5 % (ref 11.5–15.5)
WBC: 11.3 10*3/uL — ABNORMAL HIGH (ref 4.0–10.5)
nRBC: 0 % (ref 0.0–0.2)

## 2021-11-27 LAB — RESP PANEL BY RT-PCR (FLU A&B, COVID) ARPGX2
Influenza A by PCR: NEGATIVE
Influenza B by PCR: NEGATIVE
SARS Coronavirus 2 by RT PCR: NEGATIVE

## 2021-11-27 LAB — RPR: RPR Ser Ql: NONREACTIVE

## 2021-11-27 LAB — POCT FERN TEST: POCT Fern Test: POSITIVE

## 2021-11-27 MED ORDER — PHENYLEPHRINE 40 MCG/ML (10ML) SYRINGE FOR IV PUSH (FOR BLOOD PRESSURE SUPPORT)
80.0000 ug | PREFILLED_SYRINGE | INTRAVENOUS | Status: DC | PRN
  Filled 2021-11-27: qty 10

## 2021-11-27 MED ORDER — LIDOCAINE HCL (PF) 1 % IJ SOLN: 30.0000 mL | INTRAMUSCULAR | Status: DC | PRN

## 2021-11-27 MED ORDER — OXYTOCIN-SODIUM CHLORIDE 30-0.9 UT/500ML-% IV SOLN
2.5000 [IU]/h | INTRAVENOUS | Status: DC
  Administered 2021-11-27: 14:00:00 2.5 [IU]/h via INTRAVENOUS

## 2021-11-27 MED ORDER — MISOPROSTOL 25 MCG QUARTER TABLET: 25.0000 ug | ORAL_TABLET | ORAL | Status: DC | PRN

## 2021-11-27 MED ORDER — FENTANYL-BUPIVACAINE-NACL 0.5-0.125-0.9 MG/250ML-% EP SOLN
12.0000 mL/h | EPIDURAL | Status: DC | PRN
  Administered 2021-11-27: 06:00:00 12 mL/h via EPIDURAL
  Filled 2021-11-27: qty 250

## 2021-11-27 MED ORDER — SIMETHICONE 80 MG PO CHEW: 80.0000 mg | CHEWABLE_TABLET | ORAL | Status: DC | PRN

## 2021-11-27 MED ORDER — DIPHENHYDRAMINE HCL 25 MG PO CAPS: 25.0000 mg | ORAL_CAPSULE | Freq: Four times a day (QID) | ORAL | Status: DC | PRN

## 2021-11-27 MED ORDER — OXYTOCIN BOLUS FROM INFUSION
333.0000 mL | Freq: Once | INTRAVENOUS | Status: AC
  Administered 2021-11-27: 13:00:00 333 mL via INTRAVENOUS

## 2021-11-27 MED ORDER — OXYCODONE-ACETAMINOPHEN 5-325 MG PO TABS: 1.0000 | ORAL_TABLET | ORAL | Status: DC | PRN

## 2021-11-27 MED ORDER — ONDANSETRON HCL 4 MG/2ML IJ SOLN: 4.0000 mg | Freq: Four times a day (QID) | INTRAMUSCULAR | Status: DC | PRN

## 2021-11-27 MED ORDER — VANCOMYCIN HCL IN DEXTROSE 1-5 GM/200ML-% IV SOLN: 1000.0000 mg | Freq: Two times a day (BID) | INTRAVENOUS | Status: DC

## 2021-11-27 MED ORDER — COCONUT OIL OIL: 1.0000 "application " | TOPICAL_OIL | Status: DC | PRN

## 2021-11-27 MED ORDER — EPHEDRINE 5 MG/ML INJ: 10.0000 mg | INTRAVENOUS | Status: DC | PRN

## 2021-11-27 MED ORDER — ONDANSETRON HCL 4 MG PO TABS
4.0000 mg | ORAL_TABLET | ORAL | Status: DC | PRN
  Administered 2021-11-27: 17:00:00 4 mg via ORAL
  Filled 2021-11-27: qty 1

## 2021-11-27 MED ORDER — LACTATED RINGERS IV SOLN
500.0000 mL | Freq: Once | INTRAVENOUS | Status: AC
  Administered 2021-11-27: 06:00:00 500 mL via INTRAVENOUS

## 2021-11-27 MED ORDER — DIPHENHYDRAMINE HCL 50 MG/ML IJ SOLN: 12.5000 mg | INTRAMUSCULAR | Status: DC | PRN

## 2021-11-27 MED ORDER — PHENYLEPHRINE 40 MCG/ML (10ML) SYRINGE FOR IV PUSH (FOR BLOOD PRESSURE SUPPORT): 80.0000 ug | PREFILLED_SYRINGE | INTRAVENOUS | Status: DC | PRN

## 2021-11-27 MED ORDER — WITCH HAZEL-GLYCERIN EX PADS: 1.0000 "application " | MEDICATED_PAD | CUTANEOUS | Status: DC | PRN

## 2021-11-27 MED ORDER — CEFAZOLIN SODIUM-DEXTROSE 1-4 GM/50ML-% IV SOLN
1.0000 g | Freq: Three times a day (TID) | INTRAVENOUS | Status: DC
  Administered 2021-11-27: 11:00:00 1 g via INTRAVENOUS
  Filled 2021-11-27 (×2): qty 50

## 2021-11-27 MED ORDER — OXYTOCIN-SODIUM CHLORIDE 30-0.9 UT/500ML-% IV SOLN: 2.5000 [IU]/h | INTRAVENOUS | Status: DC

## 2021-11-27 MED ORDER — LACTATED RINGERS IV SOLN
500.0000 mL | INTRAVENOUS | Status: DC | PRN
Start: 2021-11-27 — End: 2021-11-27

## 2021-11-27 MED ORDER — OXYCODONE-ACETAMINOPHEN 5-325 MG PO TABS: 2.0000 | ORAL_TABLET | ORAL | Status: DC | PRN

## 2021-11-27 MED ORDER — ACETAMINOPHEN 325 MG PO TABS: 650.0000 mg | ORAL_TABLET | ORAL | Status: DC | PRN

## 2021-11-27 MED ORDER — TERBUTALINE SULFATE 1 MG/ML IJ SOLN: 0.2500 mg | Freq: Once | INTRAMUSCULAR | Status: DC | PRN

## 2021-11-27 MED ORDER — SOD CITRATE-CITRIC ACID 500-334 MG/5ML PO SOLN: 30.0000 mL | ORAL | Status: DC | PRN

## 2021-11-27 MED ORDER — OXYTOCIN BOLUS FROM INFUSION: 333.0000 mL | Freq: Once | INTRAVENOUS | Status: DC

## 2021-11-27 MED ORDER — MEASLES, MUMPS & RUBELLA VAC IJ SOLR
0.5000 mL | Freq: Once | INTRAMUSCULAR | Status: AC
  Administered 2021-11-29: 0.5 mL via SUBCUTANEOUS
  Filled 2021-11-27: qty 0.5

## 2021-11-27 MED ORDER — IBUPROFEN 600 MG PO TABS
600.0000 mg | ORAL_TABLET | Freq: Four times a day (QID) | ORAL | Status: DC
  Administered 2021-11-27 – 2021-11-29 (×8): 600 mg via ORAL
  Filled 2021-11-27 (×8): qty 1

## 2021-11-27 MED ORDER — BENZOCAINE-MENTHOL 20-0.5 % EX AERO: 1.0000 "application " | INHALATION_SPRAY | CUTANEOUS | Status: DC | PRN

## 2021-11-27 MED ORDER — DIBUCAINE (PERIANAL) 1 % EX OINT: 1.0000 "application " | TOPICAL_OINTMENT | CUTANEOUS | Status: DC | PRN

## 2021-11-27 MED ORDER — FENTANYL CITRATE (PF) 100 MCG/2ML IJ SOLN
50.0000 ug | INTRAMUSCULAR | Status: DC | PRN
  Administered 2021-11-27: 04:00:00 50 ug via INTRAVENOUS
  Filled 2021-11-27: qty 2

## 2021-11-27 MED ORDER — OXYTOCIN-SODIUM CHLORIDE 30-0.9 UT/500ML-% IV SOLN
1.0000 m[IU]/min | INTRAVENOUS | Status: DC
  Administered 2021-11-27: 04:00:00 2 m[IU]/min via INTRAVENOUS
  Filled 2021-11-27: qty 500

## 2021-11-27 MED ORDER — FENTANYL CITRATE (PF) 100 MCG/2ML IJ SOLN: 50.0000 ug | INTRAMUSCULAR | Status: DC | PRN

## 2021-11-27 MED ORDER — LIDOCAINE HCL (PF) 1 % IJ SOLN
INTRAMUSCULAR | Status: DC | PRN
  Administered 2021-11-27: 8 mL via EPIDURAL

## 2021-11-27 MED ORDER — ONDANSETRON HCL 4 MG/2ML IJ SOLN: 4.0000 mg | INTRAMUSCULAR | Status: DC | PRN

## 2021-11-27 MED ORDER — MAGNESIUM HYDROXIDE 400 MG/5ML PO SUSP: 30.0000 mL | ORAL | Status: DC | PRN

## 2021-11-27 MED ORDER — LACTATED RINGERS IV SOLN
INTRAVENOUS | Status: DC
  Administered 2021-11-27: 06:00:00 950 mL via INTRAVENOUS

## 2021-11-27 MED ORDER — LACTATED RINGERS IV SOLN
INTRAVENOUS | Status: DC
  Administered 2021-11-27: 02:00:00 800 mL via INTRAVENOUS

## 2021-11-27 MED ORDER — LACTATED RINGERS IV SOLN: 500.0000 mL | INTRAVENOUS | Status: DC | PRN

## 2021-11-27 MED ORDER — TETANUS-DIPHTH-ACELL PERTUSSIS 5-2.5-18.5 LF-MCG/0.5 IM SUSY: 0.5000 mL | PREFILLED_SYRINGE | Freq: Once | INTRAMUSCULAR | Status: DC

## 2021-11-27 MED ORDER — CEFAZOLIN SODIUM-DEXTROSE 2-4 GM/100ML-% IV SOLN
2.0000 g | Freq: Once | INTRAVENOUS | Status: AC
Start: 2021-11-27 — End: 2021-11-27
  Administered 2021-11-27: 03:00:00 2 g via INTRAVENOUS
  Filled 2021-11-27: qty 100

## 2021-11-27 MED ORDER — PRENATAL MULTIVITAMIN CH
1.0000 | ORAL_TABLET | Freq: Every day | ORAL | Status: DC
  Administered 2021-11-28 – 2021-11-29 (×2): 1 via ORAL
  Filled 2021-11-27 (×2): qty 1

## 2021-11-27 MED ORDER — FENTANYL-BUPIVACAINE-NACL 0.5-0.125-0.9 MG/250ML-% EP SOLN: 12.0000 mL/h | EPIDURAL | Status: DC | PRN

## 2021-11-27 NOTE — Progress Notes (Addendum)
Kara Castillo is a 25 y.o. G2P0010 at [redacted]w[redacted]d.  Subjective: Comfortable w/ epidural.  Denies HA, vision changes or epigastric pain.   Objective: BP 121/69    Pulse 72    Temp 98.3 F (36.8 C) (Oral)    Resp 18    Ht 5\' 7"  (1.702 m)    Wt 98 kg    LMP 02/23/2021    SpO2 100%    BMI 33.83 kg/m    FHT:  FHR: 130 bpm, variability: mod,  accelerations:  15x15,  decelerations:  earlies UC:   Q 2-5 minutes, mod-strong Dilation: 5 Effacement (%): 80 Station: 0 Presentation: Vertex Exam by:: [email or web address], CNM ROM x 12 hours.   Labs: Results for orders placed or performed during the hospital encounter of 11/26/21 (from the past 24 hour(s))  Fern Test     Status: Abnormal   Collection Time: 11/27/21 12:54 AM  Result Value Ref Range   POCT Fern Test Positive = ruptured amniotic membanes   Resp Panel by RT-PCR (Flu A&B, Covid) Nasopharyngeal Swab     Status: None   Collection Time: 11/27/21 12:57 AM   Specimen: Nasopharyngeal Swab; Nasopharyngeal(NP) swabs in vial transport medium  Result Value Ref Range   SARS Coronavirus 2 by RT PCR NEGATIVE NEGATIVE   Influenza A by PCR NEGATIVE NEGATIVE   Influenza B by PCR NEGATIVE NEGATIVE  CBC     Status: Abnormal   Collection Time: 11/27/21 12:58 AM  Result Value Ref Range   WBC 11.3 (H) 4.0 - 10.5 K/uL   RBC 3.24 (L) 3.87 - 5.11 MIL/uL   Hemoglobin 10.2 (L) 12.0 - 15.0 g/dL   HCT 11/29/21 (L) 68.3 - 41.9 %   MCV 95.4 80.0 - 100.0 fL   MCH 31.5 26.0 - 34.0 pg   MCHC 33.0 30.0 - 36.0 g/dL   RDW 62.2 29.7 - 98.9 %   Platelets 411 (H) 150 - 400 K/uL   nRBC 0.0 0.0 - 0.2 %  Type and screen     Status: None   Collection Time: 11/27/21  1:00 AM  Result Value Ref Range   ABO/RH(D) A NEG    Antibody Screen POS    Sample Expiration      11/30/2021,2359 Performed at [facility], [address] [place], [place]   Protein / creatinine ratio, urine     Status: None   Collection Time: 11/27/21  8:55 AM  Result Value Ref Range    Creatinine, Urine 84.57 mg/dL   Total Protein, Urine 9 mg/dL   Protein Creatinine Ratio 0.11 0.00 - 0.15 mg/mg[Cre]  Comprehensive metabolic panel     Status: Abnormal   Collection Time: 11/27/21  9:30 AM  Result Value Ref Range   Sodium 132 (L) 135 - 145 mmol/L   Potassium 4.7 3.5 - 5.1 mmol/L   Chloride 102 98 - 111 mmol/L   CO2 23 22 - 32 mmol/L   Glucose, Bld 83 70 - 99 mg/dL   BUN <5 (L) 6 - 20 mg/dL   Creatinine, Ser 11/29/21 0.44 - 1.00 mg/dL   Calcium 8.6 (L) 8.9 - 10.3 mg/dL   Total Protein 5.9 (L) 6.5 - 8.1 g/dL   Albumin 2.4 (L) 3.5 - 5.0 g/dL   AST 23 15 - 41 U/L   ALT 10 0 - 44 U/L   Alkaline Phosphatase 66 38 - 126 U/L   Total Bilirubin 0.8 0.3 - 1.2 mg/dL   GFR, Estimated 0.81 >44  mL/min   Anion gap 7 5 - 15    Assessment / Plan: [redacted]w[redacted]d week IUP Labor: Early, progressing well on pitocin. ROM x 12 hours. No evidence of Triple I.  Fetal Wellbeing:  Category I Pain Control:  Epidural Anticipated MOD:  SVD GHTN: Nml Pre-E labs. No Pre-E Sx. Precautions reviewed.   Katrinka Blazing, [place], [place] 11/27/2021 9:17 AM

## 2021-11-27 NOTE — Lactation Note (Signed)
This note was copied from a baby's chart. Lactation Consultation Note  Patient Name: Girl Violetta Lavalle [id number] Date: 11/27/2021 Reason for consult: L&D Initial assessment;1st time breastfeeding;Primapara;Term Age:23 hours   L&D Initial Lactation Consult:  Visited with family < 1 hour after birth Baby awake; assisted to latch, however, she was not interested in initiating a suck.  Burped and attempted a second time; no interest.  Reassured mother that lactation services will be available on the M/B unit.  2 support persons present.   Maternal Data    Feeding Mother's Current Feeding Choice: Breast Milk  LATCH Score Latch: Too sleepy or reluctant, no latch achieved, no sucking elicited.  Audible Swallowing: None  Type of Nipple: Everted at rest and after stimulation  Comfort (Breast/Nipple): Soft / non-tender  Hold (Positioning): Assistance needed to correctly position infant at breast and maintain latch.  LATCH Score: 5   Lactation Tools Discussed/Used    Interventions Interventions: Assisted with latch;Skin to skin  Discharge    Consult Status Consult Status: Follow-up from L&D    Meriam Chojnowski Spackman R Rockie Vawter 11/27/2021, 2:15 PM

## 2021-11-27 NOTE — Anesthesia Preprocedure Evaluation (Signed)
Anesthesia Evaluation  Patient identified by MRN, date of birth, ID band Patient awake    Reviewed: Allergy & Precautions, NPO status , Patient's Chart, lab work & pertinent test results  Airway Mallampati: II  TM Distance: >3 FB Neck ROM: Full    Dental no notable dental hx.    Pulmonary neg pulmonary ROS,    Pulmonary exam normal breath sounds clear to auscultation       Cardiovascular negative cardio ROS Normal cardiovascular exam Rhythm:Regular Rate:Normal     Neuro/Psych negative neurological ROS  negative psych ROS   GI/Hepatic negative GI ROS, Neg liver ROS,   Endo/Other  negative endocrine ROS  Renal/GU negative Renal ROS  negative genitourinary   Musculoskeletal negative musculoskeletal ROS (+)   Abdominal   Peds negative pediatric ROS (+)  Hematology negative hematology ROS (+)   Anesthesia Other Findings   Reproductive/Obstetrics negative OB ROS                             Anesthesia Physical Anesthesia Plan  ASA: 2  Anesthesia Plan: Epidural   Post-op Pain Management:    Induction:   PONV Risk Score and Plan: 2 and Treatment may vary due to age or medical condition  Airway Management Planned: Natural Airway  Additional Equipment: None  Intra-op Plan:   Post-operative Plan:   Informed Consent: I have reviewed the patients History and Physical, chart, labs and discussed the procedure including the risks, benefits and alternatives for the proposed anesthesia with the patient or authorized representative who has indicated his/her understanding and acceptance.     Dental Advisory Given  Plan Discussed with: Anesthesiologist  Anesthesia Plan Comments: (Labs checked- platelets confirmed with RN in room. Fetal heart tracing, per RN, reported to be stable enough for sitting procedure. Discussed epidural, and patient consents to the procedure:  included risk of  possible headache,backache, failed block, allergic reaction, and nerve injury. This patient was asked if she had any questions or concerns before the procedure started.)        Anesthesia Quick Evaluation

## 2021-11-27 NOTE — Anesthesia Postprocedure Evaluation (Signed)
Anesthesia Post Note  Patient: Kara Castillo  Procedure(s) Performed: AN AD HOC LABOR EPIDURAL     Patient location during evaluation: Mother Baby Anesthesia Type: Epidural Level of consciousness: awake and alert Pain management: pain level controlled Vital Signs Assessment: post-procedure vital signs reviewed and stable Respiratory status: spontaneous breathing, nonlabored ventilation and respiratory function stable Cardiovascular status: stable Postop Assessment: no headache, no backache and epidural receding Anesthetic complications: no   No notable events documented.  Last Vitals:  Vitals:   11/27/21 1545 11/27/21 1701  BP: 128/82 133/82  Pulse: 64 (!) 52  Resp: 18 17  Temp: 37.1 C 36.7 C  SpO2: 99%     Last Pain:  Vitals:   11/27/21 1701  TempSrc: Oral  PainSc: 0-No pain   Pain Goal: Patients Stated Pain Goal: 0 (11/26/21 2349)                 [name]

## 2021-11-27 NOTE — H&P (Signed)
OBSTETRIC ADMISSION HISTORY AND PHYSICAL  Shya Kovatch is a 25 y.o. female G2P0010 with IUP at [redacted]w[redacted]d by LMP presenting for rupture of membranes with admission for PROM with new onset HTN on admission.  She reports gush of fluid at home requiring towels and a change of clothes. She is having irregular cramping which has not changed with fluid leaking. She denies any h/a, epigastric pain or visual disturbances and has no hx HTN during or before pregnancy.  She reports +FMs.  She plans on breast and formula feeding. She request POPs for birth control. She received her prenatal care at  [facility].    Dating: By LMP --->  Estimated Date of Delivery: 11/30/21  Sono:    @[redacted]w[redacted]d , CWD, normal anatomy, cephalic presentation, 2755g, EFW   Prenatal History/Complications: Single umbilical artery, Rh negative, GBS positive, Depression  Past Medical History: Past Medical History:  Diagnosis Date   Anxiety    Bacterial vaginosis    Major depressive disorder    Otitis media     Past Surgical History: Past Surgical History:  Procedure Laterality Date   THERAPEUTIC ABORTION     TYMPANOSTOMY TUBE PLACEMENT      Obstetrical History: OB History     Gravida  2   Para      Term      Preterm      AB  1   Living         SAB      IAB  1   Ectopic      Multiple      Live Births              Social History Social History   Socioeconomic History   Marital status: Single    Spouse name: Not on file   Number of children: Not on file   Years of education: Not on file   Highest education level: Not on file  Occupational History   Not on file  Tobacco Use   Smoking status: Never   Smokeless tobacco: Never  Vaping Use   Vaping Use: Former   Substances: Nicotine  Substance and Sexual Activity   Alcohol use: Not Currently    Comment: before she found out she was pregnant.   Drug use: Not Currently    Types: Marijuana    Comment: marijuana and tobacco mixed togther    Sexual activity: Yes    Partners: Male    Birth control/protection: None  Other Topics Concern   Not on file  Social History Narrative   Lives with mom.    Social Determinants of Health   Financial Resource Strain: Not on file  Food Insecurity: Not on file  Transportation Needs: Not on file  Physical Activity: Not on file  Stress: Not on file  Social Connections: Not on file    Family History: Family History  Problem Relation Age of Onset   Diabetes Mother    Stroke Maternal Grandmother    Hyperlipidemia Maternal Grandmother    Heart failure Maternal Grandmother    Diabetes Maternal Grandmother    Hypertension Maternal Grandmother    Prostate cancer Paternal Grandfather     Allergies: Allergies  Allergen Reactions   Penicillins Other (See Comments)    Unknown. Childhood    Medications Prior to Admission  Medication Sig Dispense Refill Last Dose   calcium carbonate (TUMS - DOSED IN MG ELEMENTAL CALCIUM) 500 MG chewable tablet Chew 1 tablet by mouth daily.   11/27/2021  ferrous sulfate 325 (65 FE) MG EC tablet Take 325 mg by mouth 3 (three) times daily with meals.   Past Week   Prenatal Vit-Fe Fumarate-FA (MULTIVITAMIN-PRENATAL) 27-0.8 MG TABS tablet Take 1 tablet by mouth daily at 12 noon.   11/27/2021     Review of Systems   All systems reviewed and negative except as stated in HPI  Blood pressure (!) 143/87, pulse 76, temperature 98.7 F (37.1 C), resp. rate 17, height 5\' 7"  (1.702 m), weight 98 kg, last menstrual period 02/23/2021, SpO2 100 %. General appearance: alert, cooperative, and no distress Lungs: clear to auscultation bilaterally Heart: regular rate and rhythm Abdomen: soft, non-tender; bowel sounds normal Pelvic: SSE with positive pooling and positive ferning on slide Extremities: Homans sign is negative, no sign of DVT DTR's wnl Presentation: cephalic Fetal monitoringBaseline: 140 bpm, moderate variability, late decels x 2, accels x  1 Uterine activityFrequency: irritability with irregular contractions Dilation: 2.5 Effacement (%): 80 Exam by:: [email or web address], CNM   Prenatal labs: ABO, Rh: A/Negative/-- (06/17 1058) Antibody: Negative (06/17 1058) Rubella: <0.90 (06/17 1058) RPR: Non Reactive (10/20 1040)  HBsAg: Negative (06/17 1058)  HIV: Non Reactive (10/20 1040)  GBS: Positive/-- (12/07 1045)  2 hr Glucola wnl  Component     Latest Ref Rng & Units 09/18/2021  Glucose, Fasting     70 - 91 mg/dL 73  Glucose, 1 hour     70 - 179 mg/dL 09/20/2021  Glucose, 2 hour     70 - 152 mg/dL 90   Genetic screening  NIPS wnl/AFP neg/Horizon neg Anatomy wnl  Prenatal Transfer Tool  Maternal Diabetes: No Genetic Screening: Normal Maternal Ultrasounds/Referrals: Normal Fetal Ultrasounds or other Referrals:  None Maternal Substance Abuse:  No Significant Maternal Medications:  None Significant Maternal Lab Results: Group B Strep positive  Results for orders placed or performed during the hospital encounter of 11/26/21 (from the past 24 hour(s))  Fern Test   Collection Time: 11/27/21 12:54 AM  Result Value Ref Range   POCT Fern Test Positive = ruptured amniotic membanes     Patient Active Problem List   Diagnosis Date Noted   Single umbilical artery 07/11/2021   Rh negative state in antepartum period 07/11/2021   Pap smear abnormality of cervix with LGSIL 05/21/2021   Rubella non-immune status, antepartum 05/17/2021   Supervision of other normal pregnancy, antepartum 05/09/2021   Hirsutism 05/29/2019   Anxiety 03/29/2019   Major depressive disorder, recurrent episode, moderate (HCC) 11/17/2016    Assessment/Plan:  Denise Washburn is a 25 y.o. G2P0010 at [redacted]w[redacted]d here for PROM and HTN  #Labor:active management with PROM. Discussed options with pt including expectant management and active management and pt selects active management if not in active labor.  #Pain: May have medications/epidural as  desired #FWB: Category II with isolated lates that resolved, will continue to monitor #ID:  GBS positive #MOF: breast/bottle #MOC:POPs #Circ:  N/a female  [redacted]w[redacted]d, CNM  11/27/2021, 12:59 AM

## 2021-11-27 NOTE — Anesthesia Procedure Notes (Signed)
Epidural Patient location during procedure: OB Start time: 11/27/2021 5:51 AM End time: 11/27/2021 5:58 AM  Staffing Anesthesiologist: Bethena Midget, MD  Preanesthetic Checklist Completed: patient identified, IV checked, site marked, risks and benefits discussed, surgical consent, monitors and equipment checked, pre-op evaluation and timeout performed  Epidural Patient position: sitting Prep: DuraPrep and site prepped and draped Patient monitoring: continuous pulse ox and blood pressure Approach: midline Location: L3-L4 Injection technique: LOR air  Needle:  Needle type: Tuohy  Needle gauge: 17 G Needle length: 9 cm and 9 Needle insertion depth: 6 cm Catheter type: closed end flexible Catheter size: 19 Gauge Catheter at skin depth: 12 cm Test dose: negative  Assessment Events: blood not aspirated, injection not painful, no injection resistance, no paresthesia and negative IV test

## 2021-11-27 NOTE — Lactation Note (Signed)
This note was copied from a baby's chart. Lactation Consultation Note  Patient Name: Girl Jearldean Gutt [id number] Date: 11/27/2021 Reason for consult: Initial assessment;1st time breastfeeding;Term Age:24 hours Per mom, infant did not latch in L&D. Mom pre-pumped her breast with hand pump,  prior to attempting to latch infant at the breast, to help evert nipple shaft out more. Mom attempted to latch infant but infant would not open her mouth nor suckle on LC's glove finger consistently . Mom was taught hand expression, LC used breast model and mom self expressed 2 mls of colostrum that was spoon feed to infant. Mom used hand pump and expressed 16 mls of colostrum and infant was given total volume of 10 mls by spoon. Mom shown how to use hand pump & how to disassemble, clean, & reassemble parts and LC discussed breast milk at room temp good 4 hours and breast milk storage. Mom placed 6 mls of EBM  in fridge. Mom's plan: 1-Mom will pre-pump breast with hand pump prior to latching infant at the breast. 2- Mom will BF infant according to feeding cues, 8 to 12+ or more times within 24 hours, skin to skin. 3- Mom knows to call RN/LC for further latch assistance if needed. 76 Mom knows to hand express or use hand pump and give infant back her EBM if infant doesn't latch at the breast.      Maternal Data Has patient been taught Hand Expression?: Yes Does the patient have breastfeeding experience prior to this delivery?: No  Feeding Mother's Current Feeding Choice: Breast Milk and Formula  LATCH Score Latch: Too sleepy or reluctant, no latch achieved, no sucking elicited.  Audible Swallowing: None  Type of Nipple: Everted at rest and after stimulation (Mom nipples are short shafted and mom could benefit from pre-pumping with hand pump prior to latching infant.)  Comfort (Breast/Nipple): Soft / non-tender  Hold (Positioning): Assistance needed to correctly position infant at breast and  maintain latch.  LATCH Score: 5   Lactation Tools Discussed/Used Tools: Pump;Flanges Flange Size: 27 Breast pump type: Manual Pump Education: Setup, frequency, and cleaning;Milk Storage Reason for Pumping: Mom will pre-pump to help evert nipple shaft out more Pumped volume: 16 mL (Mom pumped 16 mls  EBM with hand pump.)  Interventions Interventions: Breast feeding basics reviewed;Assisted with latch;Skin to skin;Breast massage;Hand express;Pre-pump if needed;Adjust position;Support pillows;Position options;Expressed milk;Education;LC Services brochure;Hand pump  Discharge    Consult Status Consult Status: Follow-up Date: 11/28/21 Follow-up type: In-patient    Danelle Earthly 11/27/2021, 6:04 PM

## 2021-11-27 NOTE — Discharge Summary (Signed)
Postpartum Discharge Summary    Patient Name: Kara Castillo DOB: 1996-04-19 MRN: [id number]  Date of admission: 11/26/2021 Delivery date:11/27/2021  Delivering provider: Manya Silvas  Date of discharge: 11/29/2021  Admitting diagnosis: PROM (premature rupture of membranes) [O42.90] Post term pregnancy over 40 weeks [O48.0] Intrauterine pregnancy: [redacted]w[redacted]d    Secondary diagnosis:  Principal Problem:   PROM (premature rupture of membranes) Active Problems:   Post term pregnancy over 40 weeks   Vaginal delivery  Additional problems: N/A   Discharge diagnosis: Term Pregnancy Delivered and Gestational Hypertension                                              Post partum procedures: N/A Augmentation: Pitocin Complications: None  Hospital course: Onset of Labor With Vaginal Delivery      25y.o. yo G2P0010 at 347w4das admitted in Latent Labor on 11/26/2021. Augmented with pitocin. Patient had an uncomplicated labor course as follows:  Membrane Rupture Time/Date: 9:00 PM ,11/26/2021   Delivery Method:Vaginal, Spontaneous  Episiotomy: None  Lacerations:  None  Patient had an complicated postpartum course.  She is ambulating, tolerating a regular diet, passing flatus, and urinating well. Patient is discharged home in stable condition on 11/29/21.  Newborn Data: Birth date:11/27/2021  Birth time:1:22 PM  Gender:Female  Living status:Living  Apgars:7 ,9  Weight:3280 g   Magnesium Sulfate received: No BMZ received: No Rhophylac:No MMR:N/A T-DaP:Given prenatally Flu: Yes Transfusion:No  Physical exam  Vitals:   11/27/21 1402 11/27/21 1415 11/27/21 1432 11/27/21 1447  BP: 133/66 134/68 129/83 133/77  Pulse: 75 66 (!) 56 (!) 57  Resp: _0 Temp:   98.7 F (37.1 C)   TempSrc:   Oral   SpO2:      Weight:      Height:       General: alert, cooperative, and no distress Lochia: appropriate Uterine Fundus: firm Incision: N/A DVT Evaluation: No evidence of DVT  seen on physical exam. Labs: Lab Results  Component Value Date   WBC 11.3 (H) 11/27/2021   HGB 10.2 (L) 11/27/2021   HCT 30.9 (L) 11/27/2021   MCV 95.4 11/27/2021   PLT 411 (H) 11/27/2021   CMP Latest Ref Rng & Units 11/27/2021  Glucose 70 - 99 mg/dL 83  BUN 6 - 20 mg/dL <5(L)  Creatinine 0.44 - 1.00 mg/dL 0.76  Sodium 135 - 145 mmol/L 132(L)  Potassium 3.5 - 5.1 mmol/L 4.7  Chloride 98 - 111 mmol/L 102  CO2 22 - 32 mmol/L 23  Calcium 8.9 - 10.3 mg/dL 8.6(L)  Total Protein 6.5 - 8.1 g/dL 5.9(L)  Total Bilirubin 0.3 - 1.2 mg/dL 0.8  Alkaline Phos 38 - 126 U/L 66  AST 15 - 41 U/L 23  ALT 0 - 44 U/L 10   Edinburgh Score: No flowsheet data found.   After visit meds:  Allergies as of 11/29/2021       Reactions   Penicillins Other (See Comments)   Unknown. Childhood        Medication List     STOP taking these medications    calcium carbonate 500 MG chewable tablet Commonly known as: TUMS - dosed in mg elemental calcium   ferrous sulfate 325 (65 FE) MG EC tablet       TAKE these medications    acetaminophen  MG tablet °Commonly known as: Tylenol °Take 2 tablets (650 mg total) by mouth every 4 (four) hours as needed (for pain scale < 4). °  °ibuprofen 600 MG tablet °Commonly known as: ADVIL °Take 1 tablet (600 mg total) by mouth every 6 (six) hours. °  °multivitamin-prenatal 27-0.8 MG Tabs tablet °Take 1 tablet by mouth daily at 12 noon. °  ° °  ° ° ° °Discharge home in stable condition °Infant Feeding: Breast °Infant Disposition:home with mother °Discharge instruction: per After Visit Summary and Postpartum booklet. °Activity: Advance as tolerated. Pelvic rest for 6 weeks.  °Diet: routine diet °Future Appointments:No future appointments. °Follow up Visit: ° ° °Please schedule this patient for a In person postpartum visit in 4 weeks with the following provider: Any provider. °Additional Postpartum F/U:BP check 2-3 days  °High risk pregnancy complicated by:  HTN °Delivery mode:  Vaginal, Spontaneous  °Anticipated Birth Control:  OCPs ° ° °Samantha Weinhold, MSA, MSN, CNM °Certified Nurse Midwife, Faculty Practice °[facility] ° ° ° ° °

## 2021-11-28 MED ORDER — FUROSEMIDE 20 MG PO TABS
20.0000 mg | ORAL_TABLET | Freq: Every day | ORAL | Status: DC
  Administered 2021-11-28 – 2021-11-29 (×2): 20 mg via ORAL
  Filled 2021-11-28 (×2): qty 1

## 2021-11-28 NOTE — Lactation Note (Signed)
This note was copied from a baby's chart. Lactation Consultation Note  Patient Name: Kara Castillo Date: 11/28/2021 Reason for consult: Follow-up assessment;Mother's request;Term;Breastfeeding assistance Age:24 hours  LC assisted with latching with signs of milk transfer. Infant feeding well with adequate urine and stool.   Mom denied any pain with latch and nipples are intact with no signs of nipple trauma.   Maternal Data Has patient been taught Hand Expression?: Yes  Feeding Mother's Current Feeding Choice: Breast Milk  LATCH Score Latch: Repeated attempts needed to sustain latch, nipple held in mouth throughout feeding, stimulation needed to elicit sucking reflex.  Audible Swallowing: Spontaneous and intermittent  Type of Nipple: Everted at rest and after stimulation  Comfort (Breast/Nipple): Soft / non-tender  Hold (Positioning): Assistance needed to correctly position infant at breast and maintain latch.  LATCH Score: 8   Lactation Tools Discussed/Used    Interventions Interventions: Breast feeding basics reviewed;Assisted with latch;Skin to skin;Breast massage;Hand express;Breast compression;Adjust position;Support pillows;Position options;Expressed milk;Education;Visual merchandiser education  Discharge    Consult Status Consult Status: Follow-up Date: 11/29/21 Follow-up type: In-patient    Quynn Vilchis  [facility] 11/28/2021, 7:29 PM

## 2021-11-28 NOTE — Progress Notes (Signed)
Post Partum Day 1 Subjective: no complaints, up ad lib, voiding, tolerating PO, and + flatus  Objective: Blood pressure 126/77, pulse 70, temperature 98.3 F (36.8 C), temperature source Oral, resp. rate 18, height 5\' 7"  (1.702 m), weight 98 kg, last menstrual period 02/23/2021, SpO2 99 %.  Physical Exam:  General: alert, cooperative, and no distress Lochia: appropriate Uterine Fundus: firm Incision: n/a DVT Evaluation: No evidence of DVT seen on physical exam.  Recent Labs    11/27/21 0058  HGB 10.2*  HCT 30.9*    Assessment/Plan: Plan for discharge tomorrow and Breastfeeding   LOS: 1 day   11/29/21 11/28/2021, 7:34 AM

## 2021-11-29 MED ORDER — IBUPROFEN 600 MG PO TABS: 600.0000 mg | ORAL_TABLET | Freq: Four times a day (QID) | ORAL | 0 refills | Status: DC

## 2021-11-29 MED ORDER — ACETAMINOPHEN 325 MG PO TABS
650.0000 mg | ORAL_TABLET | ORAL | 0 refills | Status: AC | PRN
Start: 2021-11-29 — End: 2021-12-29

## 2021-11-29 NOTE — Lactation Note (Signed)
This note was copied from a baby's chart. Lactation Consultation Note  Patient Name: Kara Castillo RUEAV'W Date: 11/29/2021 Reason for consult: Follow-up assessment Age:24 hours  Mom had pumped milk in bottle infant had not taken yet.  Infant was cueing to feed.  LC assisted with latching and placed EBM in bottle back into the refrigerator.    When infant sucked gloved finger, lips flanged easily, thin lingual frenulum easily visible.  Mom feels positioning is difficult and supporting breast is difficult.  LC used a pillow and rolled blanket to support the breast prior to latching.  Worked with mom on supporting baby's head and neck when latching.  Mom latched with LC assistance in laid back position.  Infant latched with wide gape and flanged lips.  Multiple loud swallows easily heard.  Baby had good rhythmic sucking and swallowing.    Infant BF on both sides during consult.  LC answered questions and addressed concerns.  Mom is aware of support groups and brochure reviewed with OP LC and phone line resources.    Maternal Data Has patient been taught Hand Expression?: Yes  Feeding Mother's Current Feeding Choice: Breast Milk  LATCH Score Latch: Grasps breast easily, tongue down, lips flanged, rhythmical sucking.  Audible Swallowing: Spontaneous and intermittent  Type of Nipple: Everted at rest and after stimulation  Comfort (Breast/Nipple): Soft / non-tender  Hold (Positioning): Assistance needed to correctly position infant at breast and maintain latch.  LATCH Score: 9   Lactation Tools Discussed/Used Breast pump type: Manual  Interventions Interventions: Position options;Support pillows;Adjust position;Breast compression;Assisted with latch;Education;LC Services brochure  Discharge Discharge Education: Engorgement and breast care;Warning signs for feeding baby  Consult Status Consult Status: Complete Date: 11/29/21 Follow-up type: In-patient    Maryruth Hancock [facility] 11/29/2021, 11:59 AM

## 2021-12-01 LAB — TYPE AND SCREEN
ABO/RH(D): A NEG
Antibody Screen: POSITIVE
Unit division: 0
Unit division: 0
Unit division: 0
Unit division: 0

## 2021-12-01 LAB — BPAM RBC
Blood Product Expiration Date: 202301112359
Blood Product Expiration Date: 202301112359
Blood Product Expiration Date: 202301112359
Blood Product Expiration Date: 202301142359
Unit Type and Rh: 600
Unit Type and Rh: 600
Unit Type and Rh: 600
Unit Type and Rh: 600

## 2021-12-03 ENCOUNTER — Inpatient Hospital Stay (HOSPITAL_COMMUNITY): Payer: Medicaid Other

## 2021-12-03 ENCOUNTER — Ambulatory Visit (INDEPENDENT_AMBULATORY_CARE_PROVIDER_SITE_OTHER): Payer: Medicaid Other

## 2021-12-03 ENCOUNTER — Other Ambulatory Visit: Payer: Self-pay

## 2021-12-03 VITALS — BP 123/81 | HR 83 | Ht 67.0 in | Wt 194.0 lb

## 2021-12-03 DIAGNOSIS — Z0130 Encounter for examination of blood pressure without abnormal findings: Secondary | ICD-10-CM

## 2021-12-03 NOTE — Progress Notes (Signed)
Subjective:  Kara Castillo is a 26 y.o. female here for BP check.   Hypertension ROS: no TIA's, no chest pain on exertion, no dyspnea on exertion, and no swelling of ankles.    Objective:  BP 123/81 (BP Location: Left Arm, Cuff Size: Normal)    Pulse 83    Ht 5\' 7"  (1.702 m)    Wt 194 lb (88 kg)    LMP 02/23/2021    Breastfeeding Yes    BMI 30.38 kg/m   Appearance alert, well appearing, and in no distress. General exam BP noted to be well controlled today in office.    Assessment:   Blood Pressure well controlled.   Plan:  Per D. Ervin, Current treatment plan is effective, no change in therapy.  Patient was advised to check BP at home and call 02/25/2021 if she develops sx.  Make an appointment for her PP visit.

## 2021-12-03 NOTE — Progress Notes (Signed)
Agree with nurses's documentation of this patient's clinic encounter.  Raeford Brandenburg L, MD  

## 2021-12-09 ENCOUNTER — Telehealth (HOSPITAL_COMMUNITY): Payer: Self-pay

## 2021-12-09 NOTE — Telephone Encounter (Signed)
"  I'm doing good." Patient declines questions or concerns about her healing.  "She's doing good. Eating and gaining weight. She sleeps in a bassinet." RN reviewed ABC's of safe sleep with patient. Patient declines any questions or concerns about baby.  EPDS- Line disconnected after completing question #1. RN attempted to call patient back and got no answer, voicemail left to return nurse call.   Marcelino [facility] 12/09/2021,1248

## 2021-12-09 NOTE — Progress Notes (Signed)
EPDS- Line disconnected after completing question #1. RN attempted to call patient back and got no answer, voicemail left to return nurse call.

## 2021-12-22 ENCOUNTER — Encounter: Payer: Medicaid Other | Admitting: Licensed Clinical Social Worker

## 2021-12-26 ENCOUNTER — Ambulatory Visit: Payer: Medicaid Other | Admitting: Obstetrics & Gynecology

## 2022-01-07 ENCOUNTER — Encounter: Payer: Self-pay | Admitting: Obstetrics

## 2022-01-07 ENCOUNTER — Ambulatory Visit (INDEPENDENT_AMBULATORY_CARE_PROVIDER_SITE_OTHER): Payer: Medicaid Other | Admitting: Obstetrics

## 2022-01-07 ENCOUNTER — Other Ambulatory Visit: Payer: Self-pay

## 2022-01-07 DIAGNOSIS — Z3009 Encounter for other general counseling and advice on contraception: Secondary | ICD-10-CM | POA: Diagnosis not present

## 2022-01-07 DIAGNOSIS — Z30011 Encounter for initial prescription of contraceptive pills: Secondary | ICD-10-CM

## 2022-01-07 MED ORDER — NORETHINDRONE 0.35 MG PO TABS: 1.0000 | ORAL_TABLET | Freq: Every day | ORAL | 11 refills | Status: DC

## 2022-01-07 NOTE — Progress Notes (Addendum)
Post Partum Visit Note  Kara Castillo is a 26 y.o. G8P0010 female who presents for a postpartum visit. She is 6 week postpartum following a normal spontaneous vaginal delivery.  I have fully reviewed the prenatal and intrapartum course. The delivery was at 39 gestational weeks.  Anesthesia: epidural. Postpartum course has been uncomplicated. Baby is doing well. Baby is feeding by both breast and bottle - gerber soy . Bleeding no bleeding. Bowel function is normal. Bladder function is normal. Patient is not sexually active. Contraception method is none. Postpartum depression screening: negative.   The pregnancy intention screening data noted above was reviewed. Potential methods of contraception were discussed. The patient elected to proceed with Micronor   Edinburgh Postnatal Depression Scale - 01/07/22 0857       Edinburgh Postnatal Depression Scale:  In the Past 7 Days   I have been able to laugh and see the funny side of things. 0    I have looked forward with enjoyment to things. 0    I have blamed myself unnecessarily when things went wrong. 0    I have been anxious or worried for no good reason. 0    I have felt scared or panicky for no good reason. 0    Things have been getting on top of me. 0    I have been so unhappy that I have had difficulty sleeping. 0    I have felt sad or miserable. 0    I have been so unhappy that I have been crying. 0    The thought of harming myself has occurred to me. 0    Edinburgh Postnatal Depression Scale Total 0             Health Maintenance Due  Topic Date Due   COVID-19 Vaccine (1) Never done    The following portions of the patient's history were reviewed and updated as appropriate: allergies, current medications, past family history, past medical history, past social history, past surgical history, and problem list.  Review of Systems A comprehensive review of systems was negative.  Objective:  BP 116/75    Pulse 60    Ht 5'  7" (1.702 m)    Wt 190 lb 6.4 oz (86.4 kg)    LMP 02/23/2021    BMI 29.82 kg/m    General:  alert and no distress   Breasts:  normal  Lungs: clear to auscultation bilaterally  Heart:  regular rate and rhythm, S1, S2 normal, no murmur, click, rub or gallop  Abdomen: soft, non-tender; bowel sounds normal; no masses,  no organomegaly   Wound none  GU exam:  not indicated       Assessment:    1. Postpartum care following vaginal delivery - doing well  2. Encounter for other general counseling and advice on contraception - wants Micronor  3. Encounter for initial prescription of contraceptive pills Rx: - norethindrone (MICRONOR) 0.35 MG tablet; Take 1 tablet (0.35 mg total) by mouth daily.  Dispense: 28 tablet; Refill: 11    Plan:   Essential components of care per ACOG recommendations:  1.  Mood and well being: Patient with negative depression screening today.  - Patient tobacco use? No.   - hx of drug use? No.    2. Infant care and feeding:  -Patient currently breastmilk feeding? Yes. Discussed returning to work and pumping. Reviewed importance of draining breast regularly to support lactation.  -Social determinants of health (SDOH) reviewed in EPIC. No  concerns  3. Sexuality, contraception and birth spacing - Patient does not want a pregnancy in the next year.  Desired family size is undecided  - Reviewed forms of contraception in tiered fashion. Patient desired oral progesterone-only contraceptive today.   - Discussed birth spacing of 18 months  4. Sleep and fatigue -Encouraged family/partner/community support of 4 hrs of uninterrupted sleep to help with mood and fatigue  5. Physical Recovery  - Discussed patients delivery and complications. She describes her labor as good. - Patient had a Vaginal, no problems at delivery. Patient had a 1st degree laceration. Perineal healing reviewed. Patient expressed understanding - Patient has urinary incontinence? No. - Patient is  safe to resume physical and sexual activity  6.  Health Maintenance - HM due items addressed Yes - Last pap smear  Diagnosis  Date Value Ref Range Status  05/16/2021 - Low grade squamous intraepithelial lesion (LSIL) (A)  Final   Pap smear not done at today's visit.  -Breast Cancer screening indicated? No.   7. Chronic Disease/Pregnancy Condition follow up: None    Coral Ceo, MD [facility] for [facility], [facility], [place] 01/07/22

## 2022-01-07 NOTE — Progress Notes (Addendum)
Patient presents for PP visit. Vag delivery on 11/27/21. Desires OCP. Edinburgh: 0.

## 2022-02-10 ENCOUNTER — Ambulatory Visit: Payer: Medicaid Other | Admitting: Obstetrics

## 2022-02-11 ENCOUNTER — Other Ambulatory Visit: Payer: Self-pay

## 2022-02-11 ENCOUNTER — Ambulatory Visit: Payer: Medicaid Other | Admitting: Obstetrics and Gynecology

## 2022-02-11 NOTE — Progress Notes (Signed)
Pt not seen, scheduling error ?

## 2022-02-11 NOTE — Progress Notes (Signed)
error 

## 2022-03-19 ENCOUNTER — Ambulatory Visit: Payer: Medicaid Other | Admitting: Obstetrics

## 2022-04-27 IMAGING — US US MFM OB FOLLOW-UP
1 series · 14 of 28 positions shown · non-contrast
Comparison: none

[Series 1: us mfm ob follow-up · 49 acquisitions, 14 frames shown]
[im 2/49]
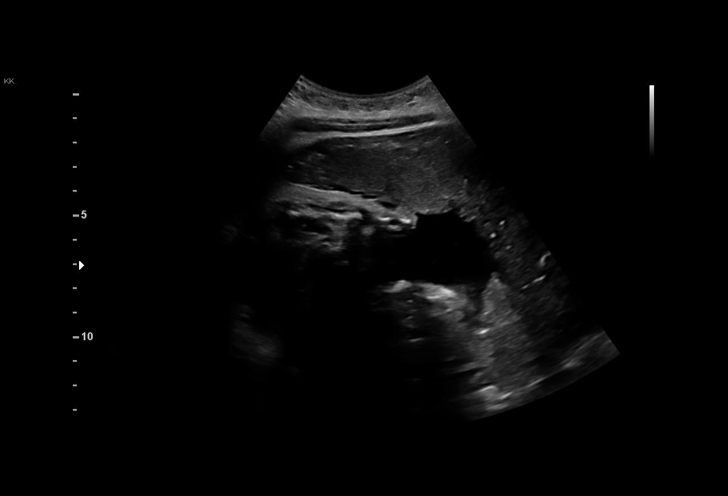
[im 6/49]
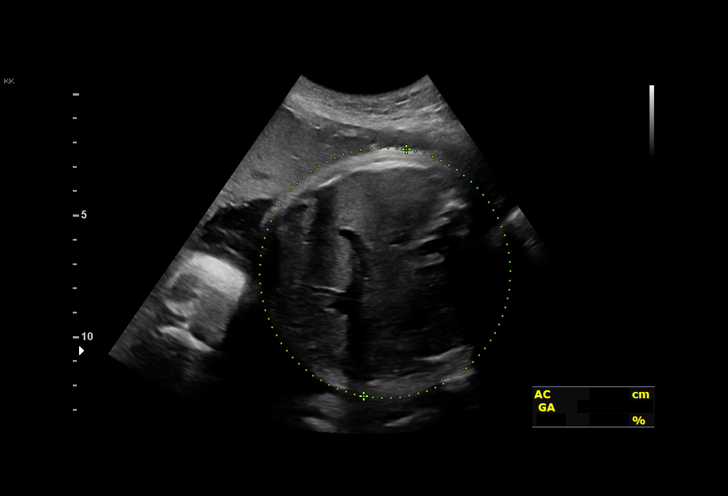
[im 9/49]
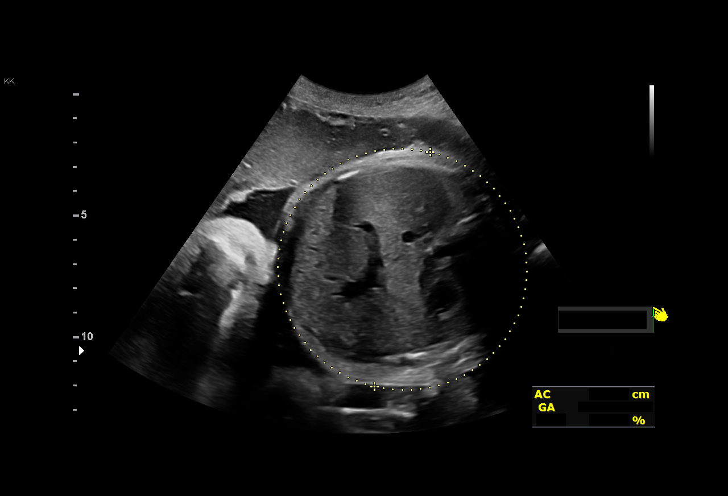
[im 13/49]
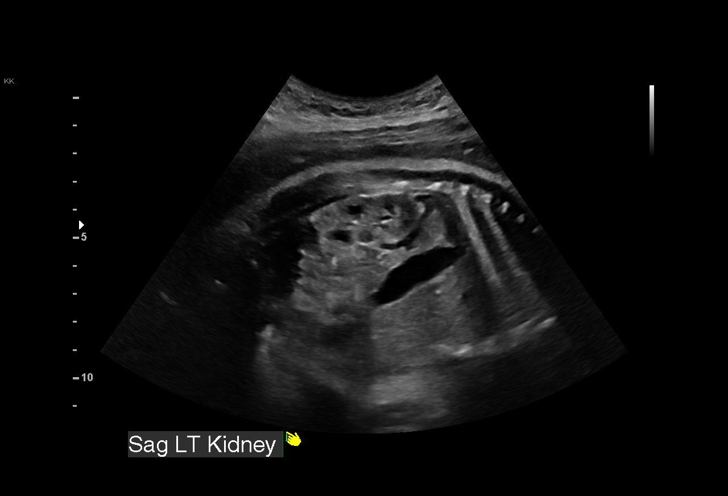
[im 17/49]
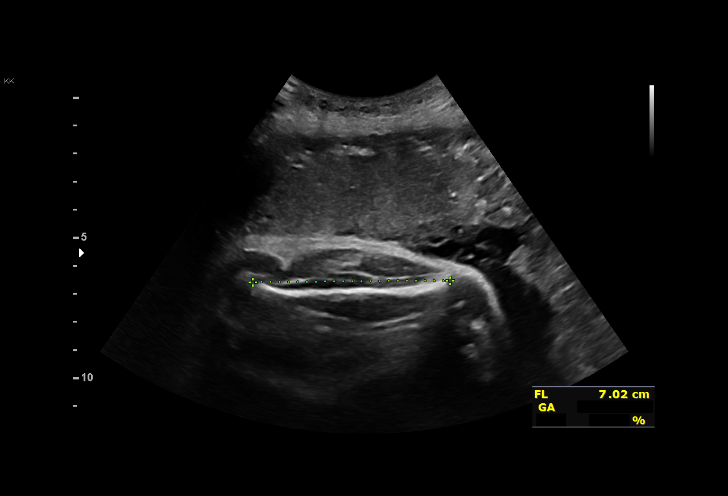
[im 20/49]
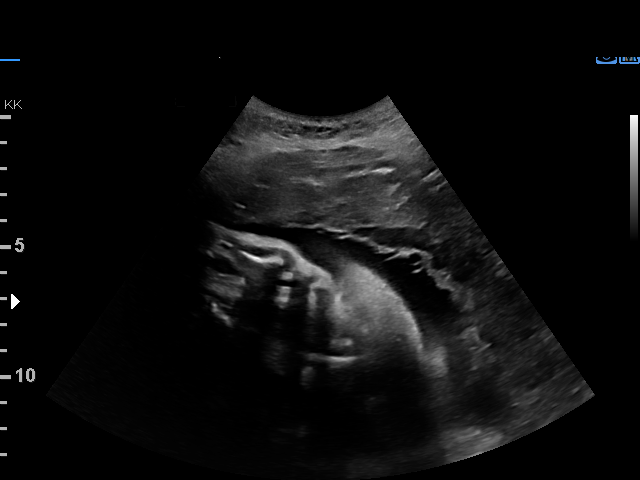
[im 24/49]
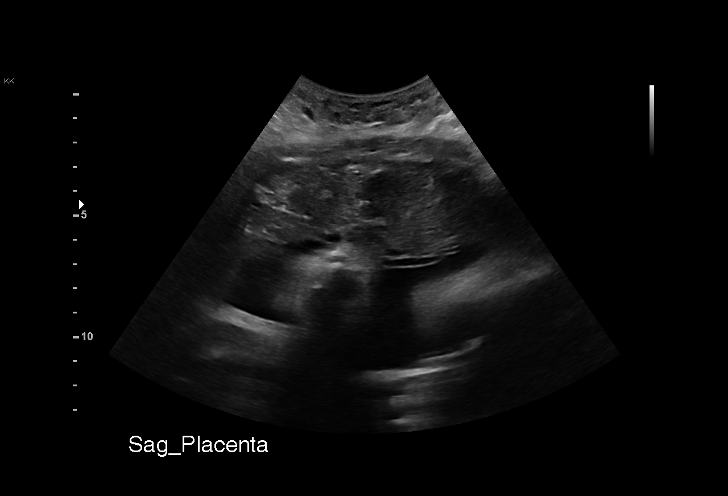
[im 27/49]
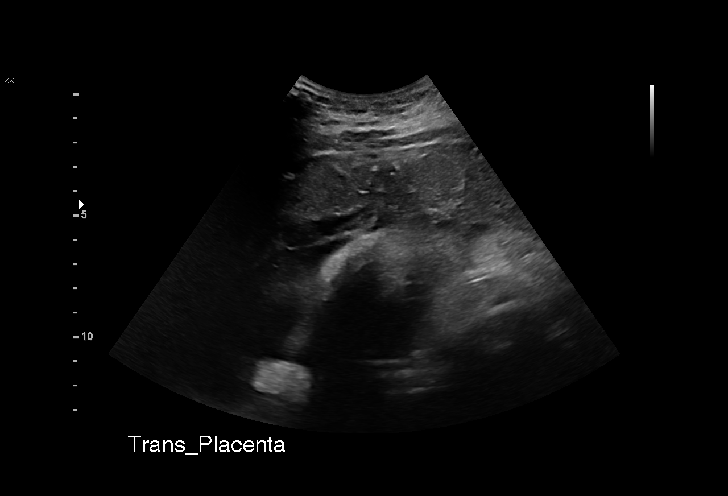
[im 31/49]
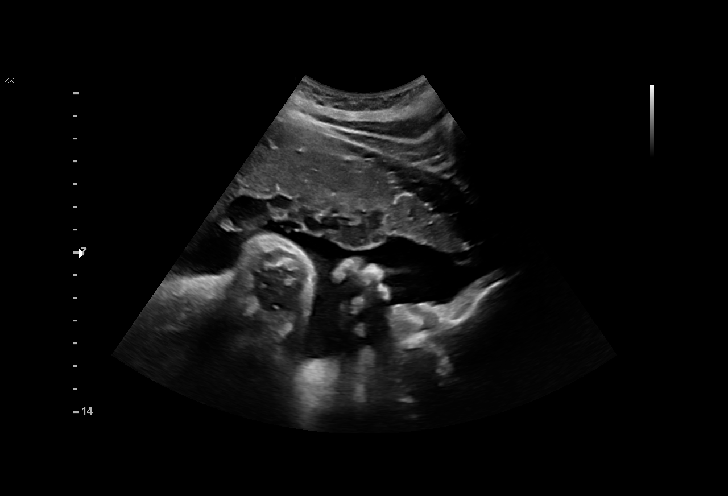
[im 34/49]
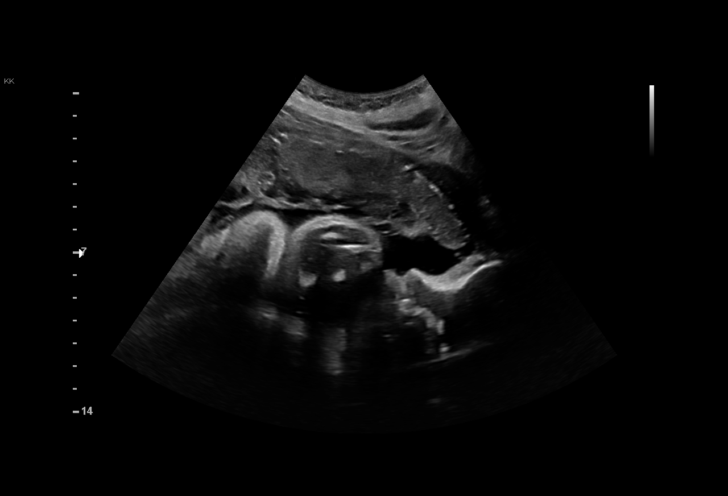
[im 38/49]
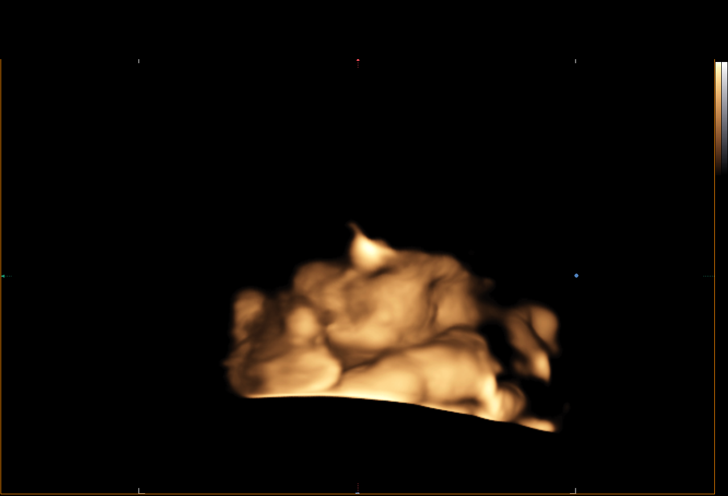
[im 41/49]
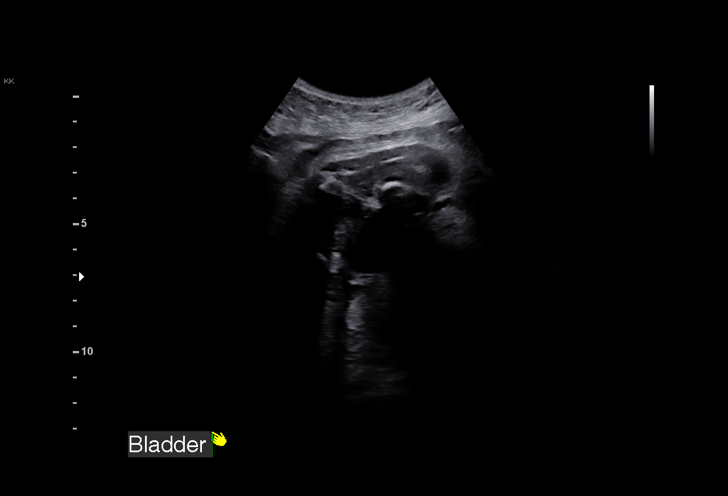
[im 45/49]
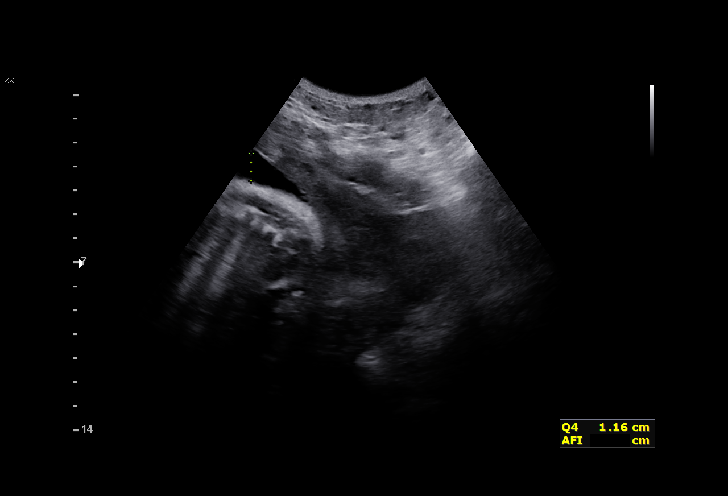
[im 49/49]
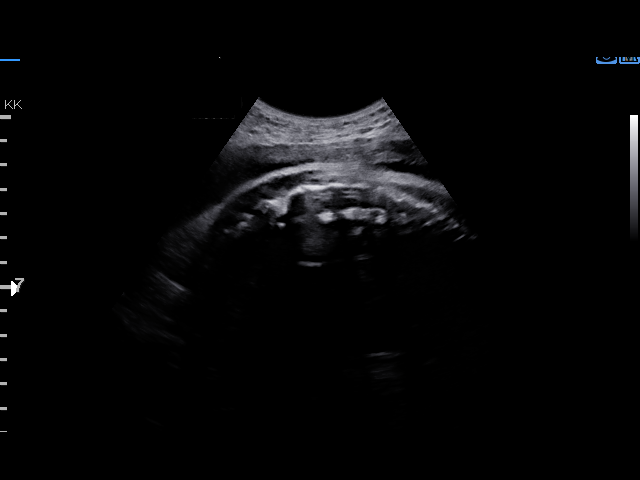

[14 of 28 positions shown; findings below may reference images not displayed]

Indications

 2 vessel umbilical cord (Absent Left)
 36 weeks gestation of pregnancy
 Fetal choroid plexus cyst (resolved)
 Encounter for other antenatal screening
 follow-up
 Low Risk NIPS(Negative Horizon)
Vital Signs

                                                Height:        5'7"
Fetal Evaluation

 Num Of Fetuses:         1
 Fetal Heart Rate(bpm):  153
 Cardiac Activity:       Observed
 Presentation:           Cephalic
 Placenta:               Anterior
 P. Cord Insertion:      Previously Visualized

 Amniotic Fluid
 AFI FV:      Within normal limits

 AFI Sum(cm)     %Tile       Largest Pocket(cm)
 13.8            51

 RUQ(cm)       RLQ(cm)       LUQ(cm)        LLQ(cm)

Biometry

 BPD:      83.5  mm     G. Age:  33w 4d        2.6  %    CI:        68.74   %    70 - 86
                                                         FL/HC:      21.6   %    20.8 -
 HC:      321.8  mm     G. Age:  36w 2d         18  %    HC/AC:      1.00        0.92 -
 AC:      321.9  mm     G. Age:  36w 1d         49  %    FL/BPD:     83.2   %    71 - 87
 FL:       69.5  mm     G. Age:  35w 5d         24  %    FL/AC:      21.6   %    20 - 24

 Est. FW:    5777  gm      6 lb 1 oz     31  %
OB History

 Gravidity:    2         Term:   0        Prem:   0        SAB:   1
 TOP:          0       Ectopic:  0        Living: 0
Gestational Age

 LMP:           36w 4d        Date:  02/23/21                 EDD:   11/30/21
 U/S Today:     35w 3d                                        EDD:   12/08/21
 Best:          36w 4d     Det. By:  LMP  (02/23/21)          EDD:   11/30/21
Anatomy

 Cranium:               Appears normal         Aortic Arch:            Previously seen
 Cavum:                 Previously seen        Ductal Arch:            Previously seen
 Ventricles:            Previously seen        Diaphragm:              Appears normal
 Choroid Plexus:        Previously seen        Stomach:                Appears normal, left
                                                                       sided
 Cerebellum:            Previously seen        Abdomen:                Previously seen
 Posterior Fossa:       Previously seen        Abdominal Wall:         Previously seen
 Nuchal Fold:           Not applicable (>20    Cord Vessels:           2 vessel cord,
                        wks GA)
                                                                       absent Mir Ahmad Zuzu
 Face:                  Appears normal         Kidneys:                Appear normal
                        (orbits and profile)
 Lips:                  Appears normal         Bladder:                Appears normal
 Thoracic:              Previously seen        Spine:                  Previously seen
 Heart:                 Previously seen        Upper Extremities:      Previously seen
 RVOT:                  Previously seen        Lower Extremities:      Previously seen
 LVOT:                  Previously seen

 Other:  Heels and Hands prev visualized. Technically difficult due to fetal
         position.
Cervix Uterus Adnexa

 Cervix
 Not visualized (advanced GA >74wks)
Comments

 This patient was seen for a follow up growth scan due to a
 fetus with a two-vessel cord.  She denies any problems since
 her last exam.
 She was informed that the fetal growth and amniotic fluid
 level appears appropriate for her gestational age.
 Fetal movements and fetal breathing movements were noted
 throughout today's exam.
 As the fetal growth is within normal limits, no further exams
 were scheduled in our office.

## 2022-05-06 ENCOUNTER — Ambulatory Visit: Payer: Medicaid Other

## 2023-12-01 NOTE — L&D Delivery Note (Addendum)
 OB/GYN Delivery Note  Kara Castillo is a 28 y.o. [id number] s/p SVD at [redacted]w[redacted]d. She was admitted for SOL.   ROM: 1h 11m with clear fluid GBS Status: positive Maximum Maternal Temperature: 36.6  Labor Progress: Presented in active labor, progressed to complete without intervention  Delivery Date/Time: 11/29 2230 Delivery: Called to room and patient was complete and pushing. Head delivered straight occiput anterior. nuchal cord present and easily reduced. Shoulder and body delivered in usual fashion. Infant with spontaneous cry, placed on mother's abdomen, dried and stimulated. Cord clamped x 2 after 1-minute delay, and cut by father. Placenta delivered spontaneously with gentle cord traction. Fundus firm with massage and Pitocin . Labia, perineum, vagina, and cervix inspected and no lacerations were appreciated.   Placenta:  spontaneous Intact Complications:none Lacerations: n/a EBL: Analgesia: Epidural  Newborn Data: Birth date:10/28/2023 Birth time:10:30 PM Gender:Female Living status:Living Apgars:8 ,9  Weight:3010 g          Kara Carpen, MD   Attestation of Supervision of Student:  I confirm that I have verified the information documented in the resident's note and that I have also personally directly supervised the history, physical exam and all medical decision making activities.  I have verified that all services and findings are accurately documented in this student's note; and I agree with management and plan as outlined in the documentation. I have also made any necessary editorial changes.   Kara LITTIE Angles, MD OB Fellow 10/29/2024 1:31 AM

## 2024-02-24 ENCOUNTER — Ambulatory Visit

## 2024-02-24 VITALS — BP 125/79 | HR 78

## 2024-02-24 DIAGNOSIS — Z3201 Encounter for pregnancy test, result positive: Secondary | ICD-10-CM | POA: Diagnosis not present

## 2024-02-24 DIAGNOSIS — Z3200 Encounter for pregnancy test, result unknown: Secondary | ICD-10-CM

## 2024-02-24 LAB — POCT URINE PREGNANCY: Preg Test, Ur: POSITIVE — AB

## 2024-02-24 MED ORDER — PRENATE MINI 18-0.6-0.4-350 MG PO CAPS: 1.0000 | ORAL_CAPSULE | Freq: Every day | ORAL | 11 refills | Status: AC

## 2024-02-24 NOTE — Progress Notes (Signed)
..  Kara Castillo presents today for UPT. She has no unusual complaints. LMP:01/24/24    OBJECTIVE: Appears well, in no apparent distress.  OB History     Gravida  3   Para  1   Term  1   Preterm      AB  1   Living  1      SAB      IAB  1   Ectopic      Multiple      Live Births  1          Home UPT Result:Positive In-Office UPT result:Positive I have reviewed the patient's medical, obstetrical, social, and family histories, and medications.   ASSESSMENT: Positive pregnancy test  PLAN Prenatal care to be completed at: [facility] Provided safe med list PNV sent to pharmacy

## 2024-03-23 ENCOUNTER — Encounter

## 2024-04-04 ENCOUNTER — Encounter

## 2024-04-05 ENCOUNTER — Ambulatory Visit: Admitting: *Deleted

## 2024-04-05 ENCOUNTER — Other Ambulatory Visit (INDEPENDENT_AMBULATORY_CARE_PROVIDER_SITE_OTHER): Payer: Self-pay

## 2024-04-05 VITALS — BP 124/80 | HR 92 | Wt 230.1 lb

## 2024-04-05 DIAGNOSIS — Z3480 Encounter for supervision of other normal pregnancy, unspecified trimester: Secondary | ICD-10-CM

## 2024-04-05 DIAGNOSIS — Z3A10 10 weeks gestation of pregnancy: Secondary | ICD-10-CM | POA: Diagnosis not present

## 2024-04-05 DIAGNOSIS — Z3481 Encounter for supervision of other normal pregnancy, first trimester: Secondary | ICD-10-CM

## 2024-04-05 DIAGNOSIS — O3680X0 Pregnancy with inconclusive fetal viability, not applicable or unspecified: Secondary | ICD-10-CM

## 2024-04-05 DIAGNOSIS — Z1339 Encounter for screening examination for other mental health and behavioral disorders: Secondary | ICD-10-CM | POA: Diagnosis not present

## 2024-04-05 NOTE — Progress Notes (Signed)
 New OB Intake  I connected with Kara Castillo  on 04/05/24 at  2:10 PM EDT by In Person Visit and verified that I am speaking with the correct person using two identifiers. Nurse is located at [facility] and pt is located at [place].  I discussed the limitations, risks, security and privacy concerns of performing an evaluation and management service by telephone and the availability of in person appointments. I also discussed with the patient that there may be a patient responsible charge related to this service. The patient expressed understanding and agreed to proceed.  I explained I am completing New OB Intake today. We discussed EDD of 10/30/2024, by Last Menstrual Period. Pt is G3P1011. I reviewed her allergies, medications and Medical/Surgical/OB history.    Patient Active Problem List   Diagnosis Date Noted   PROM (premature rupture of membranes) 11/27/2021   Post term pregnancy over 40 weeks 11/27/2021   Vaginal delivery 11/27/2021   Single umbilical artery 07/11/2021   Rh negative state in antepartum period 07/11/2021   Pap smear abnormality of cervix with LGSIL 05/21/2021   Rubella non-immune status, antepartum 05/17/2021   Supervision of other normal pregnancy, antepartum 05/09/2021   Hirsutism 05/29/2019   Anxiety 03/29/2019   Major depressive disorder, recurrent episode, moderate (HCC) 11/17/2016     Concerns addressed today  Delivery Plans Plans to deliver at [facility]. Discussed the nature of our practice with multiple providers including residents and students. Due to the size of the practice, the delivering provider may not be the same as those providing prenatal care.   Patient is interested in water birth.  MyChart/Babyscripts MyChart access verified. I explained pt will have some visits in office and some virtually. Babyscripts instructions given and order placed. Patient verifies receipt of registration text/e-mail. Account successfully created and app downloaded. If  patient is a candidate for Optimized scheduling, add to sticky note.   Blood Pressure Cuff/Weight Scale Has BP cuff at home. Explained after first prenatal appt pt will check weekly and document in Babyscripts. Patient does not have weight scale; patient may purchase if they desire to track weight weekly in Babyscripts.  Anatomy US  Explained first scheduled US  will be around 19 weeks. Anatomy US  scheduled for TBD at TBD.  Is patient a candidate for Babyscripts Optimization? No, due to Risk Factors   First visit review I reviewed new OB appt with patient. Explained pt will be seen by Loetta Ringer, CNM at first visit. Discussed [place] genetic screening with patient. Requests Panorama and Horizon.. Routine prenatal labs  collected at today's visit.    Last Pap Diagnosis  Date Value Ref Range Status  05/16/2021 - Low grade squamous intraepithelial lesion (LSIL) (A)  Final    Donette Furlong, RN 04/05/2024  2:22 PM

## 2024-04-05 NOTE — Patient Instructions (Signed)

## 2024-04-07 LAB — CBC/D/PLT+RPR+RH+ABO+RUBIGG...
Antibody Screen: NEGATIVE
Basophils Absolute: 0 10*3/uL (ref 0.0–0.2)
Basos: 0 %
EOS (ABSOLUTE): 0.1 10*3/uL (ref 0.0–0.4)
Eos: 1 %
HCV Ab: NONREACTIVE
HIV Screen 4th Generation wRfx: NONREACTIVE
Hematocrit: 37.3 % (ref 34.0–46.6)
Hemoglobin: 12.6 g/dL (ref 11.1–15.9)
Hepatitis B Surface Ag: NEGATIVE
Immature Grans (Abs): 0 10*3/uL (ref 0.0–0.1)
Immature Granulocytes: 0 %
Lymphocytes Absolute: 2.6 10*3/uL (ref 0.7–3.1)
Lymphs: 26 %
MCH: 32 pg (ref 26.6–33.0)
MCHC: 33.8 g/dL (ref 31.5–35.7)
MCV: 95 fL (ref 79–97)
Monocytes Absolute: 0.4 10*3/uL (ref 0.1–0.9)
Monocytes: 4 %
Neutrophils Absolute: 6.8 10*3/uL (ref 1.4–7.0)
Neutrophils: 69 %
Platelets: 348 10*3/uL (ref 150–450)
RBC: 3.94 x10E6/uL (ref 3.77–5.28)
RDW: 12 % (ref 11.7–15.4)
RPR Ser Ql: NONREACTIVE
Rh Factor: NEGATIVE
Rubella Antibodies, IGG: 1 {index} (ref >0.99)
WBC: 10 10*3/uL (ref 3.4–10.8)

## 2024-04-07 LAB — HEMOGLOBIN A1C
Est. average glucose Bld gHb Est-mCnc: 103 mg/dL
Hgb A1c MFr Bld: 5.2 % (ref 4.8–5.6)

## 2024-04-07 LAB — URINE CULTURE, OB REFLEX: Organism ID, Bacteria: NO GROWTH

## 2024-04-07 LAB — CULTURE, OB URINE

## 2024-04-07 LAB — HCV INTERPRETATION

## 2024-04-11 ENCOUNTER — Ambulatory Visit: Payer: Self-pay | Admitting: Obstetrics and Gynecology

## 2024-04-20 ENCOUNTER — Encounter

## 2024-04-21 ENCOUNTER — Telehealth: Payer: Self-pay | Admitting: Licensed Clinical Social Worker

## 2024-04-21 NOTE — Telephone Encounter (Signed)
 [facility] contacted this patient to provide [facility] intro and to schedule an appointment. [facility] left a VM.

## 2024-05-02 ENCOUNTER — Ambulatory Visit: Admitting: Obstetrics and Gynecology

## 2024-05-02 DIAGNOSIS — Z9189 Other specified personal risk factors, not elsewhere classified: Secondary | ICD-10-CM | POA: Insufficient documentation

## 2024-05-02 NOTE — Progress Notes (Unsigned)
 Subjective:   Kara Castillo is a 28 y.o. G3P1011 at [redacted]w[redacted]d by {Ob dating:14516} being seen today for her first obstetrical visit.  Her obstetrical history is significant for {ob risk factors:10154}. Patient {does/does not:19097} intend to breast feed. Pregnancy history fully reviewed.  Patient reports {sx:14538}.  HISTORY: OB History  Gravida Para Term Preterm AB Living  3 1 1  0 1 1  SAB IAB Ectopic Multiple Live Births  0 1 0 0 1    # Outcome Date GA Lbr Len/2nd Weight Sex Type Anes PTL Lv  3 Current           2 Term 11/27/21 [redacted]w[redacted]d   F Vag-Spont   LIV  1 IAB 2019             Last pap smear: Lab Results  Component Value Date   DIAGPAP - Low grade squamous intraepithelial lesion (LSIL) (A) 05/16/2021   HPVHIGH Positive (A) 05/16/2021   ***  Past Medical History:  Diagnosis Date   Anxiety    Bacterial vaginosis    Major depressive disorder    Otitis media    Past Surgical History:  Procedure Laterality Date   THERAPEUTIC ABORTION     TYMPANOSTOMY TUBE PLACEMENT     Family History  Problem Relation Age of Onset   Diabetes Mother    Obesity Mother    Stroke Maternal Grandmother    Hyperlipidemia Maternal Grandmother    Heart failure Maternal Grandmother    Diabetes Maternal Grandmother    Hypertension Maternal Grandmother    Prostate cancer Paternal Grandfather    Social History   Tobacco Use   Smoking status: Never   Smokeless tobacco: Never  Vaping Use   Vaping status: Former   Substances: Nicotine  Substance Use Topics   Alcohol use: Not Currently    Comment: before she found out she was pregnant.   Drug use: Not Currently    Types: Marijuana    Comment: marijuana and tobacco mixed togther   Allergies  Allergen Reactions   Penicillins Other (See Comments)    Unknown. Childhood   Current Outpatient Medications on File Prior to Visit  Medication Sig Dispense Refill   cetirizine  (ZYRTEC ) 10 MG tablet Take 10 mg by mouth daily. (Patient not  taking: Reported on 04/05/2024)     ferrous sulfate 325 (65 FE) MG tablet Take 325 mg by mouth daily with breakfast. (Patient not taking: Reported on 04/05/2024)     Prenat-FeCbn-FeAsp-Meth-FA-DHA (PRENATE MINI ) 18-0.6-0.4-350 MG CAPS Take 1 tablet by mouth daily. 30 capsule 11   No current facility-administered medications on file prior to visit.     Exam   There were no vitals filed for this visit.    General:  Alert, oriented and cooperative. Patient is in no acute distress.  Breast: Symmetric, no masses or nipple discharge *** deferred  Cardiovascular: Normal heart rate noted  Respiratory: Normal respiratory effort, no problems with respiration noted  Abdomen: Soft, gravid, appropriate for gestational age.        Pelvic: {Blank single:19197::"Cervical exam performed in the presence of a chaperone","Cervical exam deferred"}        Extremities: Normal range of motion.     Mental Status: Normal mood and affect. Normal behavior. Normal judgment and thought content.    Assessment:   Pregnancy: G3P1011 Patient Active Problem List   Diagnosis Date Noted   At increased risk for intimate partner violence 05/02/2024   Supervision of other normal pregnancy, antepartum  04/05/2024   Rh negative state in antepartum period 07/11/2021   Pap smear abnormality of cervix with LGSIL 05/21/2021   Anxiety 03/29/2019   Major depressive disorder, recurrent episode, moderate (HCC) 11/17/2016     Plan:  1. [redacted] weeks gestation of pregnancy (Primary) 2. Supervision of other normal pregnancy, antepartum Initial labs reviewed - Rh negative, otherwise normal Pap, GC/CT collected Continue prenatal vitamins. Genetic Screening discussed: NIPS, carrier screening and AFP, results reviewed. LR NIPS, horizon negative in previous pregnancy. AFP next visit Ultrasound discussed; fetal anatomic survey: scheduled 7/11. Problem list reviewed and updated. The nature of [facility]  with multiple MDs and other Advanced Practice Providers was explained to patient; also emphasized that residents, students are part of our team. Routine obstetric precautions reviewed.  3. At increased risk for intimate partner violence ***  4. Anxiety 5. Major depressive disorder, recurrent episode, moderate (HCC) ***  6. Pap smear abnormality of cervix with LGSIL LSIL/HPV+ 05/16/21 > ASCUS/HPV+ 06/09/22 > colpo CIN1 09/15/22 (care everywhere) Pap collected today  7. Rh negative state in antepartum period Rhogam at 28 weeks  No follow-ups on file.  Loralyn Rochester, MD Obstetrician & Gynecologist, [facility] for [employer], [facility]

## 2024-05-03 NOTE — Progress Notes (Signed)
Patient re-scheduled her appointment.

## 2024-05-08 ENCOUNTER — Other Ambulatory Visit (HOSPITAL_COMMUNITY)
Admission: RE | Admit: 2024-05-08 | Discharge: 2024-05-08 | Disposition: A | Discharge status: Home / Self Care | Source: Ambulatory Visit | Attending: Obstetrics | Admitting: Obstetrics

## 2024-05-08 ENCOUNTER — Ambulatory Visit (INDEPENDENT_AMBULATORY_CARE_PROVIDER_SITE_OTHER): Admitting: Obstetrics

## 2024-05-08 ENCOUNTER — Encounter: Payer: Self-pay | Admitting: Obstetrics

## 2024-05-08 VITALS — BP 112/73 | HR 73 | Wt 225.5 lb

## 2024-05-08 DIAGNOSIS — Z3482 Encounter for supervision of other normal pregnancy, second trimester: Secondary | ICD-10-CM

## 2024-05-08 DIAGNOSIS — Z3480 Encounter for supervision of other normal pregnancy, unspecified trimester: Secondary | ICD-10-CM

## 2024-05-08 DIAGNOSIS — Z3A15 15 weeks gestation of pregnancy: Secondary | ICD-10-CM

## 2024-05-08 DIAGNOSIS — Z1331 Encounter for screening for depression: Secondary | ICD-10-CM | POA: Diagnosis not present

## 2024-05-08 NOTE — Progress Notes (Unsigned)
 Subjective:    Kara Castillo is being seen today for her first obstetrical visit.  This {is/is not:9024} a planned pregnancy. She is at [redacted]w[redacted]d gestation. Her obstetrical history is significant for {ob risk factors:10154}. Relationship with FOB: {fob:16621}. Patient {does/does not:19097} intend to breast feed. Pregnancy history fully reviewed.  The information documented in the HPI was reviewed and verified.  Menstrual History: OB History     Gravida  3   Para  1   Term  1   Preterm  0   AB  1   Living  1      SAB  0   IAB  1   Ectopic  0   Multiple  0   Live Births  1           Menarche age: *** Patient's last menstrual period was 01/24/2024 (exact date).    Past Medical History:  Diagnosis Date   Anxiety    Bacterial vaginosis    Major depressive disorder    Otitis media     Past Surgical History:  Procedure Laterality Date   THERAPEUTIC ABORTION     TYMPANOSTOMY TUBE PLACEMENT      (Not in a hospital admission)  Allergies  Allergen Reactions   Penicillins Other (See Comments)    Unknown. Childhood    Social History   Tobacco Use   Smoking status: Never   Smokeless tobacco: Never  Substance Use Topics   Alcohol use: Not Currently    Comment: before she found out she was pregnant.    Family History  Problem Relation Age of Onset   Diabetes Mother    Obesity Mother    Stroke Maternal Grandmother    Hyperlipidemia Maternal Grandmother    Heart failure Maternal Grandmother    Diabetes Maternal Grandmother    Hypertension Maternal Grandmother    Prostate cancer Paternal Grandfather      Review of Systems Constitutional: negative for weight loss Gastrointestinal: negative for vomiting Genitourinary:negative for genital lesions and vaginal discharge and dysuria Musculoskeletal:negative for back pain Behavioral/Psych: negative for abusive relationship, depression, illegal drug usage and tobacco use    Objective:    BP 112/73   Pulse  73   Wt 225 lb 8 oz (102.3 kg)   LMP 01/24/2024 (Exact Date)   BMI 35.32 kg/m  General Appearance:    Alert, cooperative, no distress, appears stated age  Head:    Normocephalic, without obvious abnormality, atraumatic  Eyes:    PERRL, conjunctiva/corneas clear, EOM's intact, fundi    benign, both eyes  Ears:    Normal TM's and external ear canals, both ears  Nose:   Nares normal, septum midline, mucosa normal, no drainage    or sinus tenderness  Throat:   Lips, mucosa, and tongue normal; teeth and gums normal  Neck:   Supple, symmetrical, trachea midline, no adenopathy;    thyroid:  no enlargement/tenderness/nodules; no carotid   bruit or JVD  Back:     Symmetric, no curvature, ROM normal, no CVA tenderness  Lungs:     Clear to auscultation bilaterally, respirations unlabored  Chest Wall:    No tenderness or deformity   Heart:    Regular rate and rhythm, S1 and S2 normal, no murmur, rub   or gallop  Breast Exam:    No tenderness, masses, or nipple abnormality  Abdomen:     Soft, non-tender, bowel sounds active all four quadrants,    no masses, no organomegaly  Genitalia:  Normal female without lesion, discharge or tenderness  Extremities:   Extremities normal, atraumatic, no cyanosis or edema  Pulses:   2+ and symmetric all extremities  Skin:   Skin color, texture, turgor normal, no rashes or lesions  Lymph nodes:   Cervical, supraclavicular, and axillary nodes normal  Neurologic:   CNII-XII intact, normal strength, sensation and reflexes    throughout      Lab Review Urine pregnancy test Labs reviewed {YES NO:22349} Radiologic studies reviewed {YES NO:22349} Assessment:    Pregnancy at [redacted]w[redacted]d weeks    Plan:      Prenatal vitamins.  Counseling provided regarding continued use of seat belts, cessation of alcohol consumption, smoking or use of illicit drugs; infection precautions i.e., influenza/TDAP immunizations, toxoplasmosis,CMV, parvovirus, listeria and varicella;  workplace safety, exercise during pregnancy; routine dental care, safe medications, sexual activity, hot tubs, saunas, pools, travel, caffeine use, fish and methlymercury, potential toxins, hair treatments, varicose veins Weight gain recommendations per IOM guidelines reviewed: underweight/BMI< 18.5--> gain 28 - 40 lbs; normal weight/BMI 18.5 - 24.9--> gain 25 - 35 lbs; overweight/BMI 25 - 29.9--> gain 15 - 25 lbs; obese/BMI >30->gain  11 - 20 lbs Problem list reviewed and updated. FIRST/CF mutation testing/NIPT/QUAD SCREEN/fragile X/Ashkenazi Jewish population testing/Spinal muscular atrophy discussed: {requests/ordered/declines:14581}. Role of ultrasound in pregnancy discussed; fetal survey: {requests/ordered/declines:14581}. Amniocentesis discussed: {amniocentesis:14582}. VBAC calculator score: VBAC consent form provided No orders of the defined types were placed in this encounter.  Orders Placed This Encounter  Procedures   AFP, Serum, Open Spina Bifida    Is patient insulin dependent?:   No    Gestational Age (GA), weeks:   15    Date on which patient was at this GA:   05/08/2024    GA Calculation Method:   LMP    Number of fetuses:   1    Follow up in {numbers 0-4:31231} weeks. ***% of *** min visit spent on counseling and coordination of care.   Gabrielle Joiner, MD, FACOG Attending Obstetrician & Gynecologist, [facility] for [facility], [facility], [place] 05/08/2024

## 2024-05-08 NOTE — Progress Notes (Unsigned)
 Pt presents for rob. Pt has no questions or concerns at this time.

## 2024-05-09 LAB — CERVICOVAGINAL ANCILLARY ONLY
Bacterial Vaginitis (gardnerella): NEGATIVE
Candida Glabrata: NEGATIVE
Candida Vaginitis: POSITIVE — AB
Chlamydia: NEGATIVE
Comment: NEGATIVE
Comment: NEGATIVE
Comment: NEGATIVE
Comment: NEGATIVE
Comment: NEGATIVE
Comment: NORMAL
Neisseria Gonorrhea: NEGATIVE
Trichomonas: NEGATIVE

## 2024-05-29 DIAGNOSIS — O99210 Obesity complicating pregnancy, unspecified trimester: Secondary | ICD-10-CM | POA: Insufficient documentation

## 2024-06-07 ENCOUNTER — Encounter: Admitting: Obstetrics

## 2024-06-09 ENCOUNTER — Ambulatory Visit: Attending: Obstetrics and Gynecology | Admitting: Obstetrics and Gynecology

## 2024-06-09 ENCOUNTER — Other Ambulatory Visit: Payer: Self-pay | Admitting: *Deleted

## 2024-06-09 ENCOUNTER — Ambulatory Visit (HOSPITAL_BASED_OUTPATIENT_CLINIC_OR_DEPARTMENT_OTHER)

## 2024-06-09 DIAGNOSIS — Z3A19 19 weeks gestation of pregnancy: Secondary | ICD-10-CM | POA: Insufficient documentation

## 2024-06-09 DIAGNOSIS — O99212 Obesity complicating pregnancy, second trimester: Secondary | ICD-10-CM | POA: Diagnosis not present

## 2024-06-09 DIAGNOSIS — Z6791 Unspecified blood type, Rh negative: Secondary | ICD-10-CM | POA: Diagnosis not present

## 2024-06-09 DIAGNOSIS — O26892 Other specified pregnancy related conditions, second trimester: Secondary | ICD-10-CM | POA: Diagnosis not present

## 2024-06-09 DIAGNOSIS — E669 Obesity, unspecified: Secondary | ICD-10-CM

## 2024-06-09 DIAGNOSIS — O99342 Other mental disorders complicating pregnancy, second trimester: Secondary | ICD-10-CM | POA: Insufficient documentation

## 2024-06-09 DIAGNOSIS — Z3480 Encounter for supervision of other normal pregnancy, unspecified trimester: Secondary | ICD-10-CM

## 2024-06-09 DIAGNOSIS — O99210 Obesity complicating pregnancy, unspecified trimester: Secondary | ICD-10-CM

## 2024-06-09 DIAGNOSIS — F419 Anxiety disorder, unspecified: Secondary | ICD-10-CM

## 2024-06-09 DIAGNOSIS — O358XX0 Maternal care for other (suspected) fetal abnormality and damage, not applicable or unspecified: Secondary | ICD-10-CM

## 2024-06-09 DIAGNOSIS — O360130 Maternal care for anti-D [Rh] antibodies, third trimester, not applicable or unspecified: Secondary | ICD-10-CM | POA: Diagnosis not present

## 2024-06-09 DIAGNOSIS — Z363 Encounter for antenatal screening for malformations: Secondary | ICD-10-CM | POA: Diagnosis present

## 2024-06-09 DIAGNOSIS — O360120 Maternal care for anti-D [Rh] antibodies, second trimester, not applicable or unspecified: Secondary | ICD-10-CM

## 2024-06-09 LAB — PANORAMA PRENATAL TEST FULL PANEL:PANORAMA TEST PLUS 5 ADDITIONAL MICRODELETIONS: FETAL FRACTION: 8.6

## 2024-06-09 NOTE — Progress Notes (Signed)
 Maternal-Fetal Medicine Consultation Name: Miette Molenda MRN: [id number]  G3 P1011 at 19w 4d gestation.  Patient is here for fetal anatomy scan.  On cell-free fetal DNA screening, the risks of fetal aneuploidies are not increased.  Her blood type is A- and antibody screening is negative.  Patient does not remember whether she received RhoGAM after her previous delivery. Obstetric history is significant for a term vaginal delivery.  Ultrasound We performed fetal anatomy scan. No makers of aneuploidies or fetal structural defects are seen. Fetal biometry is consistent with her previously-established dates. Amniotic fluid is normal and good fetal activity is seen. Patient understands the limitations of ultrasound in detecting fetal anomalies.  Rh Negative Mother Patient's blood type is Rh negative.  She has cell free fetal DNA screening (panorama).  We can obtain fetal Rh antigen status from Panorama with no additional charge. I counseled the patient that studies have shown that cell free fetal DNA screening detects 100% of Rh-positive fetuses.  If the fetus is Rh negative, RhoGAM may not be given during pregnancy. Patient requested fetal Rh antigen status.  Our genetic counselor requested determination of Rh antigen status.  We will communicate the results to the patient and the results should be available in EPIC.  Recommendations - An appointment was made for her to return in 6 weeks for completion of fetal anatomy. - If fetal Rh negative, RhoGAM need not be administered at [redacted] weeks gestation.  Consultation including face-to-face (more than 50%) counseling 15 minutes.

## 2024-06-12 ENCOUNTER — Encounter: Admitting: Obstetrics

## 2024-06-19 ENCOUNTER — Encounter: Payer: Self-pay | Admitting: Advanced Practice Midwife

## 2024-06-19 ENCOUNTER — Ambulatory Visit (INDEPENDENT_AMBULATORY_CARE_PROVIDER_SITE_OTHER): Admitting: Advanced Practice Midwife

## 2024-06-19 ENCOUNTER — Ambulatory Visit: Payer: Self-pay | Admitting: Licensed Clinical Social Worker

## 2024-06-19 VITALS — BP 118/66 | HR 74 | Wt 228.0 lb

## 2024-06-19 DIAGNOSIS — Z3480 Encounter for supervision of other normal pregnancy, unspecified trimester: Secondary | ICD-10-CM

## 2024-06-19 DIAGNOSIS — O26899 Other specified pregnancy related conditions, unspecified trimester: Secondary | ICD-10-CM

## 2024-06-19 DIAGNOSIS — O26812 Pregnancy related exhaustion and fatigue, second trimester: Secondary | ICD-10-CM | POA: Diagnosis not present

## 2024-06-19 DIAGNOSIS — O99342 Other mental disorders complicating pregnancy, second trimester: Secondary | ICD-10-CM

## 2024-06-19 DIAGNOSIS — F32A Depression, unspecified: Secondary | ICD-10-CM

## 2024-06-19 DIAGNOSIS — Z3A21 21 weeks gestation of pregnancy: Secondary | ICD-10-CM | POA: Diagnosis not present

## 2024-06-19 DIAGNOSIS — R102 Pelvic and perineal pain: Secondary | ICD-10-CM

## 2024-06-19 DIAGNOSIS — Z6791 Unspecified blood type, Rh negative: Secondary | ICD-10-CM
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
## 2024-06-19 NOTE — Progress Notes (Signed)
 Pt presents for ROB visit. Pt c/o fatigue.

## 2024-06-27 ENCOUNTER — Ambulatory Visit: Admitting: Licensed Clinical Social Worker

## 2024-06-27 ENCOUNTER — Encounter: Payer: Self-pay | Admitting: Licensed Clinical Social Worker

## 2024-06-27 DIAGNOSIS — O99342 Other mental disorders complicating pregnancy, second trimester: Secondary | ICD-10-CM

## 2024-06-27 DIAGNOSIS — F32A Depression, unspecified: Secondary | ICD-10-CM
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
## 2024-07-04 ENCOUNTER — Encounter: Admitting: Licensed Clinical Social Worker

## 2024-07-05 ENCOUNTER — Ambulatory Visit: Admitting: Licensed Clinical Social Worker

## 2024-07-13 NOTE — BH Specialist Note (Signed)
 Patient no showed for today's visit.

## 2024-07-14 ENCOUNTER — Ambulatory Visit: Admitting: Licensed Clinical Social Worker

## 2024-07-14 DIAGNOSIS — F32A Depression, unspecified: Secondary | ICD-10-CM
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
## 2024-07-17 ENCOUNTER — Ambulatory Visit (HOSPITAL_BASED_OUTPATIENT_CLINIC_OR_DEPARTMENT_OTHER)

## 2024-07-17 ENCOUNTER — Ambulatory Visit: Attending: Obstetrics and Gynecology | Admitting: Maternal & Fetal Medicine

## 2024-07-17 ENCOUNTER — Encounter: Admitting: Obstetrics and Gynecology

## 2024-07-17 ENCOUNTER — Other Ambulatory Visit

## 2024-07-17 VITALS — BP 117/58 | HR 75

## 2024-07-17 DIAGNOSIS — Z362 Encounter for other antenatal screening follow-up: Secondary | ICD-10-CM | POA: Insufficient documentation

## 2024-07-17 DIAGNOSIS — O99212 Obesity complicating pregnancy, second trimester: Secondary | ICD-10-CM

## 2024-07-17 DIAGNOSIS — E669 Obesity, unspecified: Secondary | ICD-10-CM | POA: Diagnosis not present

## 2024-07-17 DIAGNOSIS — F419 Anxiety disorder, unspecified: Secondary | ICD-10-CM | POA: Diagnosis not present

## 2024-07-17 DIAGNOSIS — Z3A25 25 weeks gestation of pregnancy: Secondary | ICD-10-CM | POA: Insufficient documentation

## 2024-07-17 DIAGNOSIS — O358XX0 Maternal care for other (suspected) fetal abnormality and damage, not applicable or unspecified: Secondary | ICD-10-CM

## 2024-07-17 DIAGNOSIS — O360120 Maternal care for anti-D [Rh] antibodies, second trimester, not applicable or unspecified: Secondary | ICD-10-CM

## 2024-07-17 DIAGNOSIS — O99210 Obesity complicating pregnancy, unspecified trimester: Secondary | ICD-10-CM

## 2024-07-17 DIAGNOSIS — O99342 Other mental disorders complicating pregnancy, second trimester: Secondary | ICD-10-CM

## 2024-07-17 DIAGNOSIS — Z6791 Unspecified blood type, Rh negative: Secondary | ICD-10-CM | POA: Diagnosis not present

## 2024-07-17 DIAGNOSIS — Z3480 Encounter for supervision of other normal pregnancy, unspecified trimester: Secondary | ICD-10-CM

## 2024-07-17 NOTE — Progress Notes (Signed)
 After review, MFM consult with provider is not indicated for today  William Glenn, DO 07/17/2024 4:31 PM  [facility]

## 2024-07-21 ENCOUNTER — Ambulatory Visit: Admitting: Licensed Clinical Social Worker

## 2024-07-21 DIAGNOSIS — Z91199 Patient's noncompliance with other medical treatment and regimen due to unspecified reason: Secondary | ICD-10-CM

## 2024-07-24 NOTE — BH Specialist Note (Signed)
 Patient no showed for visit

## 2024-07-27 ENCOUNTER — Encounter: Admitting: Obstetrics and Gynecology

## 2024-07-27 ENCOUNTER — Ambulatory Visit: Admitting: Certified Nurse Midwife

## 2024-07-27 DIAGNOSIS — Z3A26 26 weeks gestation of pregnancy: Secondary | ICD-10-CM

## 2024-07-27 DIAGNOSIS — Z3492 Encounter for supervision of normal pregnancy, unspecified, second trimester: Secondary | ICD-10-CM

## 2024-08-09 ENCOUNTER — Encounter: Admitting: Obstetrics and Gynecology

## 2024-08-28 ENCOUNTER — Encounter: Payer: Self-pay | Admitting: Obstetrics

## 2024-08-28 ENCOUNTER — Ambulatory Visit: Admitting: Obstetrics

## 2024-08-28 VITALS — BP 131/75 | HR 102 | Wt 237.0 lb

## 2024-08-28 DIAGNOSIS — F331 Major depressive disorder, recurrent, moderate: Secondary | ICD-10-CM

## 2024-08-28 DIAGNOSIS — Z6791 Unspecified blood type, Rh negative: Secondary | ICD-10-CM

## 2024-08-28 DIAGNOSIS — Z3480 Encounter for supervision of other normal pregnancy, unspecified trimester: Secondary | ICD-10-CM

## 2024-08-28 DIAGNOSIS — Z9189 Other specified personal risk factors, not elsewhere classified: Secondary | ICD-10-CM | POA: Diagnosis not present

## 2024-08-28 DIAGNOSIS — O99343 Other mental disorders complicating pregnancy, third trimester: Secondary | ICD-10-CM

## 2024-08-28 DIAGNOSIS — O99210 Obesity complicating pregnancy, unspecified trimester: Secondary | ICD-10-CM

## 2024-08-28 DIAGNOSIS — O3660X0 Maternal care for excessive fetal growth, unspecified trimester, not applicable or unspecified: Secondary | ICD-10-CM | POA: Diagnosis not present

## 2024-08-28 DIAGNOSIS — Z3A31 31 weeks gestation of pregnancy: Secondary | ICD-10-CM

## 2024-08-28 DIAGNOSIS — O26893 Other specified pregnancy related conditions, third trimester: Secondary | ICD-10-CM | POA: Diagnosis not present

## 2024-08-28 DIAGNOSIS — O99213 Obesity complicating pregnancy, third trimester: Secondary | ICD-10-CM

## 2024-08-28 NOTE — Progress Notes (Signed)
 Subjective:  Tricha Ruggirello is a 28 y.o. G3P1011 at [redacted]w[redacted]d being seen today for ongoing prenatal care.  She is currently monitored for the following issues for this low-risk pregnancy and has Major depressive disorder, recurrent episode, moderate (HCC); Anxiety; Pap smear abnormality of cervix with LGSIL; Rh negative state in antepartum period; Supervision of other normal pregnancy, antepartum; At increased risk for intimate partner violence; and Obesity affecting pregnancy, antepartum on their problem list.  Patient reports heartburn.  Contractions: Not present. Vag. Bleeding: None.  Movement: Present. Denies leaking of fluid.   The following portions of the patient's history were reviewed and updated as appropriate: allergies, current medications, past family history, past medical history, past social history, past surgical history and problem list. Problem list updated.  Objective:   Vitals:   08/28/24 1118  BP: 131/75  Pulse: (!) 102  Weight: 237 lb (107.5 kg)    Fetal Status: Fetal Heart Rate (bpm): 158   Movement: Present     General:  Alert, oriented and cooperative. Patient is in no acute distress.  Skin: Skin is warm and dry. No rash noted.   Cardiovascular: Normal heart rate noted  Respiratory: Normal respiratory effort, no problems with respiration noted  Abdomen: Soft, gravid, appropriate for gestational age. Pain/Pressure: Absent     Pelvic:  Cervical exam deferred        Extremities: Normal range of motion.  Edema: None  Mental Status: Normal mood and affect. Normal behavior. Normal judgment and thought content.   Urinalysis:      Assessment and Plan:  Pregnancy: G3P1011 at [redacted]w[redacted]d  1. Supervision of other normal pregnancy, antepartum (Primary)  2. Excessive fetal growth affecting management of pregnancy, antepartum, single or unspecified fetus Rx: - US  MFM OB FOLLOW UP; Future  3. Rh negative state in antepartum period - Rhogam postpartum  4. At increased risk for  intimate partner violence  5. Major depressive disorder, recurrent episode, moderate (HCC) - clinically stable  6. Obesity affecting pregnancy, antepartum, unspecified obesity type     Preterm labor symptoms and general obstetric precautions including but not limited to vaginal bleeding, contractions, leaking of fluid and fetal movement were reviewed in detail with the patient. Please refer to After Visit Summary for other counseling recommendations.   Return in about 2 weeks (around 09/11/2024) for ROB.   Rudy Carlin LABOR, MD 08/28/2024

## 2024-08-28 NOTE — Progress Notes (Signed)
ro

## 2024-08-28 NOTE — Progress Notes (Signed)
 Denies concerns today.

## 2024-08-29 ENCOUNTER — Ambulatory Visit

## 2024-09-07 ENCOUNTER — Ambulatory Visit

## 2024-09-08 ENCOUNTER — Ambulatory Visit

## 2024-09-11 ENCOUNTER — Encounter: Admitting: Obstetrics

## 2024-09-11 ENCOUNTER — Ambulatory Visit: Admitting: Family Medicine

## 2024-09-11 ENCOUNTER — Encounter: Admitting: Family Medicine

## 2024-09-11 VITALS — BP 110/72 | HR 106 | Wt 239.0 lb

## 2024-09-11 DIAGNOSIS — F331 Major depressive disorder, recurrent, moderate: Secondary | ICD-10-CM | POA: Diagnosis not present

## 2024-09-11 DIAGNOSIS — Z9189 Other specified personal risk factors, not elsewhere classified: Secondary | ICD-10-CM

## 2024-09-11 DIAGNOSIS — Z6791 Unspecified blood type, Rh negative: Secondary | ICD-10-CM | POA: Diagnosis not present

## 2024-09-11 DIAGNOSIS — Z3A33 33 weeks gestation of pregnancy: Secondary | ICD-10-CM | POA: Diagnosis not present

## 2024-09-11 DIAGNOSIS — Z3480 Encounter for supervision of other normal pregnancy, unspecified trimester: Secondary | ICD-10-CM

## 2024-09-11 DIAGNOSIS — O99210 Obesity complicating pregnancy, unspecified trimester: Secondary | ICD-10-CM

## 2024-09-11 MED ORDER — RHO D IMMUNE GLOBULIN 1500 UNIT/2ML IJ SOSY
300.0000 ug | PREFILLED_SYRINGE | Freq: Once | INTRAMUSCULAR | Status: AC
  Administered 2024-09-11: 300 ug via INTRAMUSCULAR

## 2024-09-11 NOTE — Progress Notes (Addendum)
 ROB, FLU vaccine given in LD, tolerated well, Rhogam given in RUOG, tolerated well.  Administrations This Visit     rho (d) immune globulin  (RHIG/RHOPHYLAC ) injection 300 mcg     Admin Date 09/11/2024 Action Given Dose 300 mcg Route Intramuscular Documented By Burnard Niels PARAS, RMA

## 2024-09-11 NOTE — Progress Notes (Signed)
   PRENATAL VISIT NOTE  Subjective:  Kara Castillo is a 28 y.o. G3P1011 at [redacted]w[redacted]d being seen today for ongoing prenatal care.  She is currently monitored for the following issues for this low-risk pregnancy and has Major depressive disorder, recurrent episode, moderate (HCC); Anxiety; Pap smear abnormality of cervix with LGSIL; Rh negative state in antepartum period; Supervision of other normal pregnancy, antepartum; At increased risk for intimate partner violence; and Obesity affecting pregnancy, antepartum on their problem list.  Patient reports no bleeding, no contractions, no cramping, and no leaking.  Contractions: Not present. Vag. Bleeding: None.  Movement: Present. Denies leaking of fluid.   The following portions of the patient's history were reviewed and updated as appropriate: allergies, current medications, past family history, past medical history, past social history, past surgical history and problem list.   Objective:    Vitals:   09/11/24 1526  BP: 110/72  Pulse: (!) 106  Weight: 239 lb (108.4 kg)    Fetal Status:  Fetal Heart Rate (bpm): 144   Movement: Present    General: Alert, oriented and cooperative. Patient is in no acute distress.  Skin: Skin is warm and dry. No rash noted.   Cardiovascular: Normal heart rate noted  Respiratory: Normal respiratory effort, no problems with respiration noted  Abdomen: Soft, gravid, appropriate for gestational age.  Pain/Pressure: Present     Pelvic: Cervical exam deferred        Extremities: Normal range of motion.  Edema: None  Mental Status: Normal mood and affect. Normal behavior. Normal judgment and thought content.   Assessment and Plan:  Pregnancy: G3P1011 at [redacted]w[redacted]d 1. Supervision of other normal pregnancy, antepartum (Primary) FHR BP appropriate today Had lengthy discussion with patient regarding childcare arrangements.  She is concerned about who will watch her daughter.  Informed her that her daughter is allowed as  long as there is a noted adult to take care of her while she is in labor during visiting hours but after visiting hours she must leave. - rho (d) immune globulin  (RHIG/RHOPHYLAC ) injection 300 mcg - Flu vaccine trivalent PF, 6mos and older(Flulaval,Afluria,Fluarix,Fluzone)  2. Rh negative state in antepartum period RhoGAM given today - rho (d) immune globulin  (RHIG/RHOPHYLAC ) injection 300 mcg - Flu vaccine trivalent PF, 6mos and older(Flulaval,Afluria,Fluarix,Fluzone)  3. Major depressive disorder, recurrent episode, moderate (HCC) Doing well  4. At increased risk for intimate partner violence Reports she is safe at home at this point  5. Obesity affecting pregnancy, antepartum, unspecified obesity type  6. [redacted] weeks gestation of pregnancy - rho (d) immune globulin  (RHIG/RHOPHYLAC ) injection 300 mcg - Flu vaccine trivalent PF, 6mos and older(Flulaval,Afluria,Fluarix,Fluzone)  Preterm labor symptoms and general obstetric precautions including but not limited to vaginal bleeding, contractions, leaking of fluid and fetal movement were reviewed in detail with the patient. Please refer to After Visit Summary for other counseling recommendations.   No follow-ups on file.  Future Appointments  Date Time Provider Department Center  09/20/2024 11:00 AM [facility] PROVIDER 1 [facility]  09/20/2024 11:30 AM [facility] US3 [facility]    Norleen LULLA Rover, MD

## 2024-09-20 ENCOUNTER — Ambulatory Visit (HOSPITAL_BASED_OUTPATIENT_CLINIC_OR_DEPARTMENT_OTHER)

## 2024-09-20 ENCOUNTER — Ambulatory Visit: Attending: Obstetrics | Admitting: Obstetrics

## 2024-09-20 ENCOUNTER — Other Ambulatory Visit: Payer: Self-pay | Admitting: Obstetrics

## 2024-09-20 VITALS — BP 133/66 | HR 86

## 2024-09-20 DIAGNOSIS — O99342 Other mental disorders complicating pregnancy, second trimester: Secondary | ICD-10-CM

## 2024-09-20 DIAGNOSIS — Z3A34 34 weeks gestation of pregnancy: Secondary | ICD-10-CM

## 2024-09-20 DIAGNOSIS — O360130 Maternal care for anti-D [Rh] antibodies, third trimester, not applicable or unspecified: Secondary | ICD-10-CM

## 2024-09-20 DIAGNOSIS — O3663X0 Maternal care for excessive fetal growth, third trimester, not applicable or unspecified: Secondary | ICD-10-CM | POA: Insufficient documentation

## 2024-09-20 DIAGNOSIS — E669 Obesity, unspecified: Secondary | ICD-10-CM | POA: Diagnosis not present

## 2024-09-20 DIAGNOSIS — O3660X0 Maternal care for excessive fetal growth, unspecified trimester, not applicable or unspecified: Secondary | ICD-10-CM

## 2024-09-20 DIAGNOSIS — Z362 Encounter for other antenatal screening follow-up: Secondary | ICD-10-CM | POA: Diagnosis present

## 2024-09-20 DIAGNOSIS — F419 Anxiety disorder, unspecified: Secondary | ICD-10-CM | POA: Diagnosis not present

## 2024-09-20 DIAGNOSIS — O368130 Decreased fetal movements, third trimester, not applicable or unspecified: Secondary | ICD-10-CM

## 2024-09-20 DIAGNOSIS — O99213 Obesity complicating pregnancy, third trimester: Secondary | ICD-10-CM | POA: Insufficient documentation

## 2024-09-20 DIAGNOSIS — O26843 Uterine size-date discrepancy, third trimester: Secondary | ICD-10-CM

## 2024-09-20 DIAGNOSIS — O360190 Maternal care for anti-D [Rh] antibodies, unspecified trimester, not applicable or unspecified: Secondary | ICD-10-CM | POA: Insufficient documentation

## 2024-09-20 DIAGNOSIS — O99343 Other mental disorders complicating pregnancy, third trimester: Secondary | ICD-10-CM | POA: Insufficient documentation

## 2024-09-20 DIAGNOSIS — O99210 Obesity complicating pregnancy, unspecified trimester: Secondary | ICD-10-CM

## 2024-09-20 NOTE — Progress Notes (Signed)
 MFM Consult Note  Via Kara Castillo is currently at 34 weeks and 2 days.  She was seen for an ultrasound exam to assess the fetal growth as her fundal heights have been measuring larger than her gestational age.    She reports that she has not been screened for gestational diabetes yet in her current pregnancy.  Sonographic findings Single intrauterine pregnancy. Fetal cardiac activity: Observed. Presentation: Cephalic. Fetal biometry shows the estimated fetal weight of 5 pounds 1 ounces which measures at the 31st percentile. Amniotic fluid: Within normal limits.  AFI: 10.68 cm.  MVP: 3.7 cm. Placenta: Anterior. BPP: 8/8.   There are limitations of prenatal ultrasound such as the inability to detect certain abnormalities due to poor visualization. Various factors such as fetal position, gestational age and maternal body habitus may increase the difficulty in visualizing the fetal anatomy.    The patient was encouraged to get screened for gestational diabetes as soon as possible.    As the fetal growth and fetal status were reassuring today, no further exams were scheduled in our office.    The patient stated that all of her questions were answered today.  A total of 10 minutes was spent counseling and coordinating the care for this patient.  Greater than 50% of the time was spent in direct face-to-face contact.

## 2024-09-25 ENCOUNTER — Encounter: Payer: Self-pay | Admitting: Obstetrics and Gynecology

## 2024-09-25 ENCOUNTER — Ambulatory Visit (INDEPENDENT_AMBULATORY_CARE_PROVIDER_SITE_OTHER): Admitting: Obstetrics and Gynecology

## 2024-09-25 VITALS — BP 119/71 | HR 86 | Wt 238.0 lb

## 2024-09-25 DIAGNOSIS — Z3480 Encounter for supervision of other normal pregnancy, unspecified trimester: Secondary | ICD-10-CM | POA: Diagnosis not present

## 2024-09-25 DIAGNOSIS — Z3A35 35 weeks gestation of pregnancy: Secondary | ICD-10-CM | POA: Diagnosis not present

## 2024-09-25 DIAGNOSIS — Z6791 Unspecified blood type, Rh negative: Secondary | ICD-10-CM

## 2024-09-25 NOTE — Progress Notes (Signed)
   PRENATAL VISIT NOTE  Subjective:  Kara Castillo is a 28 y.o. G3P1011 at [redacted]w[redacted]d being seen today for ongoing prenatal care.  She is currently monitored for the following issues for this low-risk pregnancy and has Major depressive disorder, recurrent episode, moderate (HCC); Anxiety; Pap smear abnormality of cervix with LGSIL; Rh negative state in antepartum period; Supervision of other normal pregnancy, antepartum; At increased risk for intimate partner violence; and Obesity affecting pregnancy, antepartum on their problem list.  Patient reports no complaints.  Contractions: Irritability. Vag. Bleeding: None.  Movement: Present. Denies leaking of fluid.   The following portions of the patient's history were reviewed and updated as appropriate: allergies, current medications, past family history, past medical history, past social history, past surgical history and problem list.   Objective:    Vitals:   09/25/24 1105  BP: 119/71  Pulse: 86  Weight: 238 lb (108 kg)    Fetal Status:  Fetal Heart Rate (bpm): 150 Fundal Height: 35 cm Movement: Present    General: Alert, oriented and cooperative. Patient is in no acute distress.  Skin: Skin is warm and dry. No rash noted.   Cardiovascular: Normal heart rate noted  Respiratory: Normal respiratory effort, no problems with respiration noted  Abdomen: Soft, gravid, appropriate for gestational age.  Pain/Pressure: Present     Pelvic: Cervical exam deferred        Extremities: Normal range of motion.  Edema: None  Mental Status: Normal mood and affect. Normal behavior. Normal judgment and thought content.   Assessment and Plan:  Pregnancy: G3P1011 at [redacted]w[redacted]d 1. Supervision of other normal pregnancy, antepartum (Primary) BP and FHR normal Doing well, feeling regular movement    2. Rh negative state in antepartum period S/p rhogam 10/13  3. [redacted] weeks gestation of pregnancy Has not had GTT, plan to schedule lab visit Anticipatory guidance  regarding swabs Normal growth on u/s 10/22 Desires waterbirth, discussed class and meeting with midwife, class info given, will schedule with midwive . Preterm labor symptoms and general obstetric precautions including but not limited to vaginal bleeding, contractions, leaking of fluid and fetal movement were reviewed in detail with the patient. Please refer to After Visit Summary for other counseling recommendations.   Return needs lab visit 2hrgtt this week, then one week OB visit with midwife.  Future Appointments  Date Time Provider Department Center  09/27/2024  8:30 AM [facility] None  10/05/2024 11:15 AM Zina Jerilynn LABOR, MD [facility] None    Nidia Daring, FNP

## 2024-09-25 NOTE — Patient Instructions (Signed)
   Considering Waterbirth? Guide for patients at Center for [facility]) Why consider waterbirth? Gentle birth for babies  Less pain medicine used in labor  May allow for passive descent/less pushing  May reduce perineal tears  More mobility and instinctive maternal position changes  Increased maternal relaxation   Is waterbirth safe? What are the risks of infection, drowning or other complications? Infection:  Very low risk (3.7 % for tub vs 4.8% for bed)  7 in 8000 waterbirths with documented infection  Poorly cleaned equipment most common cause  Slightly lower group B strep transmission rate  Drowning  Maternal:  Very low risk  Related to seizures or fainting  Newborn:  Very low risk. No evidence of increased risk of respiratory problems in multiple large studies  Physiological protection from breathing under water  Avoid underwater birth if there are any fetal complications  Once baby's head is out of the water, keep it out.  Birth complication  Some reports of cord trauma, but risk decreased by bringing baby to surface gradually  No evidence of increased risk of shoulder dystocia. Mothers can usually change positions faster in water than in a bed, possibly aiding the maneuvers to free the shoulder.   There are 2 things you MUST do to have a waterbirth with [facility]: Attend a waterbirth class at [facility] at [facility]   3rd Wednesday of every month from 7-9 pm (virtual during COVID) [employer] at [email or web address] or [email or web address] or by calling [phone] Bring us  the certificate from the class to your prenatal appointment or send via MyChart Meet with a midwife at 36 weeks* to see if you can still plan a waterbirth and to sign the consent.   *We also recommend that you schedule as many of your prenatal visits with a midwife as possible.    Helpful information: You may want to bring a bathing suit top to the hospital  to wear during labor but this is optional.  All other supplies are provided by the hospital. Please arrive at the hospital with signs of active labor, and do not wait at home until late in labor. It takes 45 min- 1 hour for fetal monitoring, and check in to your room to take place, plus transport and filling of the waterbirth tub.    Things that would prevent you from having a waterbirth: Premature, <37wks  Previous cesarean birth  Presence of thick meconium-stained fluid  Multiple gestation (Twins, triplets, etc.)  Uncontrolled diabetes or gestational diabetes requiring medication  Hypertension diagnosed in pregnancy or preexisting hypertension (gestational hypertension, preeclampsia, or chronic hypertension) Fetal growth restriction (your baby measures less than 10th percentile on ultrasound) Heavy vaginal bleeding  Non-reassuring fetal heart rate  Active infection (MRSA, etc.). Group B Strep is NOT a contraindication for waterbirth.  If your labor has to be induced and induction method requires continuous monitoring of the baby's heart rate  Other risks/issues identified by your obstetrical provider   Please remember that birth is unpredictable. Under certain unforeseeable circumstances your provider may advise against giving birth in the tub. These decisions will be made on a case-by-case basis and with the safety of you and your baby as our highest priority.    Updated 03/04/22

## 2024-09-25 NOTE — Progress Notes (Signed)
 Pt presents for ROB visit. Pt did not do GTT.

## 2024-09-27 ENCOUNTER — Other Ambulatory Visit

## 2024-10-05 ENCOUNTER — Ambulatory Visit: Admitting: Obstetrics and Gynecology

## 2024-10-05 ENCOUNTER — Other Ambulatory Visit (HOSPITAL_COMMUNITY)
Admission: RE | Admit: 2024-10-05 | Discharge: 2024-10-05 | Disposition: A | Discharge status: Home / Self Care | Source: Ambulatory Visit | Attending: Obstetrics and Gynecology | Admitting: Obstetrics and Gynecology

## 2024-10-05 VITALS — BP 117/71 | HR 101 | Wt 241.0 lb

## 2024-10-05 DIAGNOSIS — Z3A36 36 weeks gestation of pregnancy: Secondary | ICD-10-CM | POA: Diagnosis not present

## 2024-10-05 DIAGNOSIS — Z6791 Unspecified blood type, Rh negative: Secondary | ICD-10-CM | POA: Diagnosis not present

## 2024-10-05 DIAGNOSIS — Z3480 Encounter for supervision of other normal pregnancy, unspecified trimester: Secondary | ICD-10-CM

## 2024-10-05 DIAGNOSIS — Z9189 Other specified personal risk factors, not elsewhere classified: Secondary | ICD-10-CM | POA: Diagnosis not present

## 2024-10-05 NOTE — Progress Notes (Signed)
 ROB/GBS.  C/o pain and pressure

## 2024-10-05 NOTE — Progress Notes (Signed)
   PRENATAL VISIT NOTE  Subjective:  Kara Castillo is a 28 y.o. G3P1011 at [redacted]w[redacted]d being seen today for ongoing prenatal care.  She is currently monitored for the following issues for this low-risk pregnancy and has Major depressive disorder, recurrent episode, moderate (HCC); Anxiety; Pap smear abnormality of cervix with LGSIL; Rh negative state in antepartum period; Supervision of other normal pregnancy, antepartum; At increased risk for intimate partner violence; and Obesity affecting pregnancy, antepartum on their problem list.  Patient doing well with no acute concerns today. She reports no complaints.  Contractions: Irregular. Vag. Bleeding: None.  Movement: Present. Denies leaking of fluid.   The following portions of the patient's history were reviewed and updated as appropriate: allergies, current medications, past family history, past medical history, past social history, past surgical history and problem list. Problem list updated.  Objective:   Vitals:   10/05/24 1148  BP: 117/71  Pulse: (!) 101  Weight: 241 lb (109.3 kg)    Fetal Status: Fetal Heart Rate (bpm): 151 (Simultaneous filing. User may not have seen previous data.) Fundal Height: 36 cm Movement: Present     General:  Alert, oriented and cooperative. Patient is in no acute distress.  Skin: Skin is warm and dry. No rash noted.   Cardiovascular: Normal heart rate noted  Respiratory: Normal respiratory effort, no problems with respiration noted  Abdomen: Soft, gravid, appropriate for gestational age.  Pain/Pressure: Present     Pelvic: Cervical exam deferred Dilation: 1.5 Effacement (%): 70 Station: -2  Extremities: Normal range of motion.  Edema: None  Mental Status:  Normal mood and affect. Normal behavior. Normal judgment and thought content.   Assessment and Plan:  Pregnancy: G3P1011 at [redacted]w[redacted]d  1. Supervision of other normal pregnancy, antepartum (Primary) Continue routine prenatal care  - Culture, beta strep  (group b only) - Cervicovaginal ancillary only( [place])  2. [redacted] weeks gestation of pregnancy  - Culture, beta strep (group b only) - Cervicovaginal ancillary only( [place])  3. Rh negative state in antepartum period Rhogam post delivery  4. At increased risk for intimate partner violence No report of threat currently  Preterm labor symptoms and general obstetric precautions including but not limited to vaginal bleeding, contractions, leaking of fluid and fetal movement were reviewed in detail with the patient.  Please refer to After Visit Summary for other counseling recommendations.   Return in about 1 week (around 10/12/2024) for in person.   Jerilynn Buddle, MD Faculty Attending Center for [facility]

## 2024-10-06 ENCOUNTER — Ambulatory Visit: Payer: Self-pay | Admitting: Obstetrics and Gynecology

## 2024-10-06 DIAGNOSIS — Z3480 Encounter for supervision of other normal pregnancy, unspecified trimester: Secondary | ICD-10-CM

## 2024-10-06 DIAGNOSIS — O99820 Streptococcus B carrier state complicating pregnancy: Secondary | ICD-10-CM

## 2024-10-06 LAB — CERVICOVAGINAL ANCILLARY ONLY
Chlamydia: NEGATIVE
Comment: NEGATIVE
Comment: NORMAL
Neisseria Gonorrhea: NEGATIVE

## 2024-10-08 LAB — CULTURE, BETA STREP (GROUP B ONLY): Strep Gp B Culture: POSITIVE — AB

## 2024-10-09 DIAGNOSIS — O99820 Streptococcus B carrier state complicating pregnancy: Secondary | ICD-10-CM | POA: Insufficient documentation

## 2024-10-13 ENCOUNTER — Ambulatory Visit (INDEPENDENT_AMBULATORY_CARE_PROVIDER_SITE_OTHER): Admitting: Physician Assistant

## 2024-10-13 VITALS — BP 122/80 | HR 70 | Wt 243.0 lb

## 2024-10-13 DIAGNOSIS — O26893 Other specified pregnancy related conditions, third trimester: Secondary | ICD-10-CM

## 2024-10-13 DIAGNOSIS — O26899 Other specified pregnancy related conditions, unspecified trimester: Secondary | ICD-10-CM

## 2024-10-13 DIAGNOSIS — Z3480 Encounter for supervision of other normal pregnancy, unspecified trimester: Secondary | ICD-10-CM

## 2024-10-13 DIAGNOSIS — Z6791 Unspecified blood type, Rh negative: Secondary | ICD-10-CM | POA: Diagnosis not present

## 2024-10-13 DIAGNOSIS — Z6834 Body mass index (BMI) 34.0-34.9, adult: Secondary | ICD-10-CM | POA: Diagnosis not present

## 2024-10-13 DIAGNOSIS — Z3A37 37 weeks gestation of pregnancy: Secondary | ICD-10-CM | POA: Diagnosis not present

## 2024-10-13 NOTE — Progress Notes (Signed)
 PRENATAL VISIT NOTE  Subjective:  Kara Castillo is a 28 y.o. G3P1011 at [redacted]w[redacted]d being seen today for ongoing prenatal care.  She is currently monitored for the following issues for this high-risk pregnancy and has Major depressive disorder, recurrent episode, moderate (HCC); Anxiety; Pap smear abnormality of cervix with LGSIL; Rh negative state in antepartum period; Supervision of other normal pregnancy, antepartum; At increased risk for intimate partner violence; Obesity affecting pregnancy, antepartum; and Group B streptococcal carriage complicating pregnancy on their problem list.  Patient with no medical concerns.  Contractions: Irregular. Vag. Bleeding: None.  Movement: Present. Denies leaking of fluid.   The following portions of the patient's history were reviewed and updated as appropriate: allergies, current medications, past family history, past medical history, past social history, past surgical history and problem list.   Objective:   Vitals:   10/13/24 1053  BP: 122/80  Pulse: 70  Weight: 243 lb (110.2 kg)    Fetal Status:  Fetal Heart Rate (bpm): 146 Fundal Height: 36 cm Movement: Present    General: Alert, oriented and cooperative. Patient is in no acute distress.  Skin: Skin is warm and dry. No rash noted.   Cardiovascular: Normal heart rate noted  Respiratory: Normal respiratory effort, no problems with respiration noted  Abdomen: Soft, gravid, appropriate for gestational age.  Pain/Pressure: Present     Pelvic: Cervical exam deferred        Extremities: Normal range of motion.  Edema: Trace  Mental Status: Normal mood and affect. Normal behavior. Normal judgment and thought content.      10/05/2024   11:46 AM 05/08/2024    5:41 PM 04/05/2024    2:52 PM  Depression screen PHQ 2/9  Decreased Interest 0 0 1  Down, Depressed, Hopeless 0 1 1  PHQ - 2 Score 0 1 2  Altered sleeping 0 2 3  Tired, decreased energy 0 2 3  Change in appetite 0 2 0  Feeling bad or  failure about yourself  0 1 3  Trouble concentrating 0 2 0  Moving slowly or fidgety/restless 0 0 0  Suicidal thoughts 0 1 0  PHQ-9 Score 0 11  11   Difficult doing work/chores Not difficult at all       Data saved with a previous flowsheet row definition        10/05/2024   11:47 AM 05/08/2024    5:41 PM 04/05/2024    2:54 PM 06/19/2021    9:48 AM  GAD 7 : Generalized Anxiety Score  Nervous, Anxious, on Edge 0 3 1 1   Control/stop worrying 0 3 1 1   Worry too much - different things 0 3 1 1   Trouble relaxing 0 3 1 1   Restless 0 0 0 2  Easily annoyed or irritable 0 1 3 1   Afraid - awful might happen 0 0 0 1  Total GAD 7 Score 0 13 7 8   Anxiety Difficulty Not difficult at all   Somewhat difficult    Assessment and Plan:  Pregnancy: G3P1011 at [redacted]w[redacted]d  1. Supervision of other normal pregnancy, antepartum (Primary) Patient doing well, feeling regular fetal movement BP, FHR, FH appropriate  Patient without GTT on review of her results. I recommended that she still get this scheduled with the front desk.   2. [redacted] weeks gestation of pregnancy Anticipatory guidance about next visits/weeks of pregnancy given.   3. Rh negative state in antepartum period 09/25/24 and postpartum   4. BMI 34.0-34.9,adult  Term labor symptoms and general obstetric precautions including but not limited to vaginal bleeding, contractions, leaking of fluid and fetal movement were reviewed in detail with the patient.  Please refer to After Visit Summary for other counseling recommendations.   No follow-ups on file.  Future Appointments  Date Time Provider Department Center  10/18/2024 10:15 AM Rudy Carlin LABOR, MD [facility] None    Jorene FORBES Moats, PA-C

## 2024-10-13 NOTE — Progress Notes (Signed)
 No specific concerns for provider. Worried about delivering sooner with this pregnancy

## 2024-10-18 ENCOUNTER — Ambulatory Visit (INDEPENDENT_AMBULATORY_CARE_PROVIDER_SITE_OTHER): Admitting: Obstetrics

## 2024-10-18 ENCOUNTER — Encounter: Payer: Self-pay | Admitting: Obstetrics

## 2024-10-18 ENCOUNTER — Inpatient Hospital Stay (HOSPITAL_COMMUNITY)
Admission: AD | Admit: 2024-10-18 | Discharge: 2024-10-18 | Disposition: A | Discharge status: Home / Self Care | Attending: Obstetrics and Gynecology | Admitting: Obstetrics and Gynecology

## 2024-10-18 VITALS — BP 118/73 | HR 88 | Wt 238.0 lb

## 2024-10-18 DIAGNOSIS — Z3493 Encounter for supervision of normal pregnancy, unspecified, third trimester: Secondary | ICD-10-CM

## 2024-10-18 DIAGNOSIS — O368130 Decreased fetal movements, third trimester, not applicable or unspecified: Secondary | ICD-10-CM

## 2024-10-18 DIAGNOSIS — F419 Anxiety disorder, unspecified: Secondary | ICD-10-CM

## 2024-10-18 DIAGNOSIS — Z3689 Encounter for other specified antenatal screening: Secondary | ICD-10-CM

## 2024-10-18 DIAGNOSIS — Z9189 Other specified personal risk factors, not elsewhere classified: Secondary | ICD-10-CM

## 2024-10-18 DIAGNOSIS — O26899 Other specified pregnancy related conditions, unspecified trimester: Secondary | ICD-10-CM

## 2024-10-18 DIAGNOSIS — F331 Major depressive disorder, recurrent, moderate: Secondary | ICD-10-CM

## 2024-10-18 DIAGNOSIS — O99210 Obesity complicating pregnancy, unspecified trimester: Secondary | ICD-10-CM

## 2024-10-18 DIAGNOSIS — Z3A38 38 weeks gestation of pregnancy: Secondary | ICD-10-CM

## 2024-10-18 DIAGNOSIS — O99820 Streptococcus B carrier state complicating pregnancy: Secondary | ICD-10-CM

## 2024-10-18 DIAGNOSIS — Z3480 Encounter for supervision of other normal pregnancy, unspecified trimester: Secondary | ICD-10-CM

## 2024-10-18 NOTE — Discharge Instructions (Signed)
 What is a fetal movement count?  A fetal movement count  is the number of times that you feel your baby move during a certain amount of time. This may also be called a fetal kick count by some people, but fetal movement count is a more accurate term. Movements can be kicks, flutters, swishes, rolls, or jabs. A fetal movement count is recommended for every pregnant woman. You may be asked to start counting fetal movements as early as week 28 of your pregnancy. Pay attention to when your baby is most active. Your baby has times of activity and times to sleep just like you do! You may notice your baby's sleep and wake cycles. You may also notice things that make your baby move more. You should do a fetal movement count: When your baby is normally most active. At the same time each day. A good time to count movements is while you are resting, after having something to eat and drink.  How do I count fetal movements? Find a quiet, comfortable area. Sit, or lie down on your side. Write down the date, the start time and stop time, and the number of movements that you felt between those two times. Take this information with you to your health care visits. Write down your start time when you feel the first movement. Count kicks, flutters, swishes, rolls, and jabs. You should feel at least 10 movements. You may stop counting after you have felt 10 movements, or if you have been counting for 2 hours. Write down the stop time. If you do not feel 10 movements in 2 hours, contact your health care provider for further instructions. Your health care provider may want to do additional tests to assess your baby's well-being. Contact a health care provider if: You feel fewer than 10 movements in 2 hours. Your baby is not moving like he or she usually does.   Reasons to return to [facility]U at [facility]: You begin to have strong, frequent contractions 5 minutes apart or less, each last 1  minute, these have been going on for 1-2 hours, and you cannot walk or talk during them. Your water breaks.  Sometimes it is a big gush of fluid. However, many times it may it may be much more subtle. You should go to the hospital if you have a constant leakage of fluid from your vagina, enough to soak a pad when you are walking around.  You have vaginal bleeding.  It is normal to have a small amount of spotting if your cervix was checked. If you have bleeding requiring the use of a pad, go to the hospital. You don't feel your baby moving like normal.  If you think that you baby's movement is decreased, eat a snack and rest on your left side in a quiet room for one hour. If you have not felt the baby move more than 6 times in an hour GO TO THE HOSPITAL.

## 2024-10-18 NOTE — MAU Note (Signed)
 Kara Castillo is a 27 y.o. at [redacted]w[redacted]d here in MAU reporting: Sent from office for failed NST. Had mentioned to OB that she had only felt moving last nigh and only a few times today.  Reports some cramping  a few times today  LMP:  Onset of complaint: today  Pain score: 5-8 Vitals:   10/18/24 1732  BP: 112/72  Pulse: 81  Resp: 18  Temp: 98.5 F (36.9 C)     FHT: 157   Lab orders placed from triage: 157

## 2024-10-18 NOTE — Progress Notes (Signed)
 Subjective:  Kara Castillo is a 28 y.o. G3P1011 at [redacted]w[redacted]d being seen today for ongoing prenatal care.  She is currently monitored for the following issues for this low-risk pregnancy and has Major depressive disorder, recurrent episode, moderate (HCC); Anxiety; Pap smear abnormality of cervix with LGSIL; Rh negative state in antepartum period; Supervision of other normal pregnancy, antepartum; At increased risk for intimate partner violence; Obesity affecting pregnancy, antepartum; and Group B streptococcal carriage complicating pregnancy on their problem list.  Patient reports occasional contractions and decreased fetal movement.  Contractions: Irritability. Vag. Bleeding: None.  Movement: (!) Decreased. Denies leaking of fluid.   The following portions of the patient's history were reviewed and updated as appropriate: allergies, current medications, past family history, past medical history, past social history, past surgical history and problem list. Problem list updated.  Objective:   Vitals:   10/18/24 1033  BP: 118/73  Pulse: 88  Weight: 238 lb (108 kg)    Fetal Status:     Movement: (!) Decreased     General:  Alert, oriented and cooperative. Patient is in no acute distress.  Skin: Skin is warm and dry. No rash noted.   Cardiovascular: Normal heart rate noted  Respiratory: Normal respiratory effort, no problems with respiration noted  Abdomen: Soft, gravid, appropriate for gestational age. Pain/Pressure: Present     Pelvic:  Cervical exam deferred        Extremities: Normal range of motion.  Edema: Trace  Mental Status: Normal mood and affect. Normal behavior. Normal judgment and thought content.   Urinalysis:      Assessment and Plan:  Pregnancy: G3P1011 at [redacted]w[redacted]d  1. Supervision of other normal pregnancy, antepartum (Primary)  2. Rh negative state in antepartum period - Rhogam postpartum  3. Group B streptococcal carriage complicating pregnancy - treat in labor  4. At  increased risk for intimate partner violence  5. Major depressive disorder, recurrent episode, moderate (HCC) - clinically stable  6. Anxiety - clinically stable  7. Obesity affecting pregnancy, antepartum, unspecified obesity type  8. Decreased fetal movement affecting management of pregnancy in third trimester, single or unspecified fetus Rx: - Fetal nonstress test.  Non Reactive.  FHR 140's.  Good variability.  10x10 accels.  No decels   Term labor symptoms and general obstetric precautions including but not limited to vaginal bleeding, contractions, leaking of fluid and fetal movement were reviewed in detail with the patient. Please refer to After Visit Summary for other counseling recommendations.   No follow-ups on file.   Rudy Carlin LABOR, MD 10/18/2024

## 2024-10-18 NOTE — Progress Notes (Signed)
 Pt presents for rob. Pt has no questions or concerns at this time.

## 2024-10-18 NOTE — MAU Provider Note (Signed)
 Chief Complaint:  Non-stress Test   HPI   None     Kara Castillo is a 28 y.o. G3P1011 at [redacted]w[redacted]d who presents to maternity admissions for decreased fetal movement and nonreactive NST in the office today. She reports feeling movement only a few times today before her office visit. She reports that once she got to her OB office, she felt more movement. She endorses great fetal movement prior to arrival to MAU and in MAU. Denies vaginal bleeding, leaking of fluid. She has cramped a few times today, but denies regular contractions.   Pregnancy Course: Receives care at [facility] for [facility] . Prenatal records reviewed.   Past Medical History:  Diagnosis Date   Anxiety    Bacterial vaginosis    Major depressive disorder    Otitis media    OB History  Gravida Para Term Preterm AB Living  3 1 1  0 1 1  SAB IAB Ectopic Multiple Live Births  0 1 0 0 1    # Outcome Date GA Lbr Len/2nd Weight Sex Type Anes PTL Lv  3 Current           2 Term 11/27/21 [redacted]w[redacted]d   F Vag-Spont   LIV  1 IAB 2019           Past Surgical History:  Procedure Laterality Date   THERAPEUTIC ABORTION     TYMPANOSTOMY TUBE PLACEMENT     Family History  Problem Relation Age of Onset   Diabetes Mother    Obesity Mother    Stroke Maternal Grandmother    Hyperlipidemia Maternal Grandmother    Heart failure Maternal Grandmother    Diabetes Maternal Grandmother    Hypertension Maternal Grandmother    Prostate cancer Paternal Grandfather    Social History   Tobacco Use   Smoking status: Never   Smokeless tobacco: Never  Vaping Use   Vaping status: Former   Substances: Nicotine  Substance Use Topics   Alcohol use: Not Currently    Comment: before she found out she was pregnant.   Drug use: Not Currently    Types: Marijuana    Comment: marijuana and tobacco mixed togther   Allergies  Allergen Reactions   Penicillins Other (See Comments)    Unknown. Childhood   Medications Prior to Admission   Medication Sig Dispense Refill Last Dose/Taking   cetirizine  (ZYRTEC ) 10 MG tablet Take 10 mg by mouth daily.      ferrous sulfate 325 (65 FE) MG tablet Take 325 mg by mouth daily with breakfast.      Prenat-FeCbn-FeAsp-Meth-FA-DHA (PRENATE MINI ) 18-0.6-0.4-350 MG CAPS Take 1 tablet by mouth daily. 30 capsule 11     I have reviewed patient's Past Medical Hx, Surgical Hx, Family Hx, Social Hx, medications and allergies.   ROS  Pertinent items noted in HPI and remainder of comprehensive ROS otherwise negative.   PHYSICAL EXAM  Patient Vitals for the past 24 hrs:  BP Temp Pulse Resp Height Weight  10/18/24 1732 112/72 98.5 F (36.9 C) 81 18 5' 6 (1.676 m) 108 kg    Constitutional: Well-developed, well-nourished female in no acute distress.  HEENT: atraumatic, normocephalic. Neck has normal ROM. EOM intact. Cardiovascular: normal rate & rhythm, warm and well-perfused Respiratory: normal effort, no problems with respiration noted GI: Abd soft, non-tender, non-distended MSK: Extremities nontender, no edema, normal ROM Skin: warm and dry. Acyanotic, no jaundice or pallor. Neurologic: Alert and oriented x 4. No abnormal coordination. Psychiatric: Normal mood. Speech  not slurred, not rapid/pressured. Patient is cooperative.  Fetal Tracing: Baseline FHR: 145 per minute Fetal heart variability: moderate Fetal Heart Rate accelerations: yes Fetal Heart Rate decelerations: none Fetal Non-stress Test: Category I (reactive) Toco: uterine irritability   Labs: No results found for this or any previous visit (from the past 24 hours).  Imaging:  No results found.  MDM & MAU COURSE  MDM: Moderate  MAU Course: -Vital signs within normal limits. -Patient has felt vigorous fetal movement while in MAU and prior to arrival. -NST reactive. -NST and maternal perception of fetal movement reassuring of fetal well-being. If patient has persistent DFM, return to MAU.  Differential diagnosis  considered for decreased fetal movement includes but is not limited to: fetal sleep, poor maternal perception of movement, medications, early gestational age, decreased/increased amniotic fluid volume, maternal position (sitting or standing versus lying), fetal position (anterior position of the fetal spine), anterior placenta, maternal physical activity   Orders Placed This Encounter  Procedures   Discharge patient   No orders of the defined types were placed in this encounter.   ASSESSMENT   1. Movement of fetus present during pregnancy in third trimester   2. NST (non-stress test) reactive   3. [redacted] weeks gestation of pregnancy     PLAN  Discharge home in stable condition with labor precautions.  Discussed fetal sleep-wake cycles and fetal movement counts.    Allergies as of 10/18/2024       Reactions   Penicillins Other (See Comments)   Unknown. Childhood        Medication List     TAKE these medications    cetirizine  10 MG tablet Commonly known as: ZYRTEC  Take 10 mg by mouth daily.   ferrous sulfate 325 (65 FE) MG tablet Take 325 mg by mouth daily with breakfast.   Prenate Mini  18-0.6-0.4-350 MG Caps Take 1 tablet by mouth daily.        Joesph DELENA Sear, PA

## 2024-10-19 ENCOUNTER — Encounter: Admitting: Physician Assistant

## 2024-10-25 ENCOUNTER — Ambulatory Visit (INDEPENDENT_AMBULATORY_CARE_PROVIDER_SITE_OTHER): Admitting: Obstetrics

## 2024-10-25 ENCOUNTER — Encounter: Payer: Self-pay | Admitting: Obstetrics

## 2024-10-25 VITALS — BP 120/70 | HR 81 | Wt 235.9 lb

## 2024-10-25 DIAGNOSIS — Z3480 Encounter for supervision of other normal pregnancy, unspecified trimester: Secondary | ICD-10-CM | POA: Diagnosis not present

## 2024-10-25 DIAGNOSIS — Z3A39 39 weeks gestation of pregnancy: Secondary | ICD-10-CM | POA: Diagnosis not present

## 2024-10-25 NOTE — Progress Notes (Signed)
 llabPt presents for rob. Pt has no questions or concerns at this time.
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
## 2024-10-26 LAB — SYPHILIS: RPR W/REFLEX TO RPR TITER AND TREPONEMAL ANTIBODIES, TRADITIONAL SCREENING AND DIAGNOSIS ALGORITHM: RPR Ser Ql: NONREACTIVE

## 2024-10-26 LAB — HIV ANTIBODY (ROUTINE TESTING W REFLEX): HIV Screen 4th Generation wRfx: NONREACTIVE

## 2024-10-28 ENCOUNTER — Inpatient Hospital Stay (HOSPITAL_COMMUNITY)
Admission: AD | Admit: 2024-10-28 | Discharge: 2024-10-30 | DRG: 807 | Disposition: A | Attending: Obstetrics and Gynecology | Admitting: Obstetrics and Gynecology

## 2024-10-28 ENCOUNTER — Inpatient Hospital Stay (HOSPITAL_COMMUNITY): Admitting: Anesthesiology

## 2024-10-28 ENCOUNTER — Encounter (HOSPITAL_COMMUNITY): Payer: Self-pay | Admitting: Obstetrics and Gynecology

## 2024-10-28 ENCOUNTER — Other Ambulatory Visit: Payer: Self-pay

## 2024-10-28 DIAGNOSIS — O99344 Other mental disorders complicating childbirth: Secondary | ICD-10-CM

## 2024-10-28 DIAGNOSIS — O99324 Drug use complicating childbirth: Secondary | ICD-10-CM

## 2024-10-28 DIAGNOSIS — Z3480 Encounter for supervision of other normal pregnancy, unspecified trimester: Principal | ICD-10-CM

## 2024-10-28 DIAGNOSIS — O99820 Streptococcus B carrier state complicating pregnancy: Secondary | ICD-10-CM

## 2024-10-28 DIAGNOSIS — Z8249 Family history of ischemic heart disease and other diseases of the circulatory system: Secondary | ICD-10-CM

## 2024-10-28 DIAGNOSIS — F1290 Cannabis use, unspecified, uncomplicated: Secondary | ICD-10-CM

## 2024-10-28 DIAGNOSIS — F17210 Nicotine dependence, cigarettes, uncomplicated: Secondary | ICD-10-CM | POA: Diagnosis present

## 2024-10-28 DIAGNOSIS — Z3A39 39 weeks gestation of pregnancy: Secondary | ICD-10-CM

## 2024-10-28 DIAGNOSIS — O26893 Other specified pregnancy related conditions, third trimester: Principal | ICD-10-CM | POA: Diagnosis present

## 2024-10-28 DIAGNOSIS — O99214 Obesity complicating childbirth: Secondary | ICD-10-CM

## 2024-10-28 DIAGNOSIS — Z833 Family history of diabetes mellitus: Secondary | ICD-10-CM

## 2024-10-28 DIAGNOSIS — Z880 Allergy status to penicillin: Secondary | ICD-10-CM

## 2024-10-28 DIAGNOSIS — O99824 Streptococcus B carrier state complicating childbirth: Secondary | ICD-10-CM | POA: Diagnosis present

## 2024-10-28 DIAGNOSIS — O99334 Smoking (tobacco) complicating childbirth: Secondary | ICD-10-CM | POA: Diagnosis present

## 2024-10-28 DIAGNOSIS — Z6791 Unspecified blood type, Rh negative: Secondary | ICD-10-CM

## 2024-10-28 LAB — CBC
HCT: 33.9 % — ABNORMAL LOW (ref 36.0–46.0)
Hemoglobin: 11.2 g/dL — ABNORMAL LOW (ref 12.0–15.0)
MCH: 31.7 pg (ref 26.0–34.0)
MCHC: 33 g/dL (ref 30.0–36.0)
MCV: 96 fL (ref 80.0–100.0)
Platelets: 415 K/uL — ABNORMAL HIGH (ref 150–400)
RBC: 3.53 MIL/uL — ABNORMAL LOW (ref 3.87–5.11)
RDW: 12.5 % (ref 11.5–15.5)
WBC: 12.6 K/uL — ABNORMAL HIGH (ref 4.0–10.5)
nRBC: 0 % (ref 0.0–0.2)

## 2024-10-28 LAB — TYPE AND SCREEN
ABO/RH(D): A NEG
Antibody Screen: POSITIVE

## 2024-10-28 MED ORDER — MEASLES, MUMPS & RUBELLA VAC ~~LOC~~ SUSR: 0.5000 mL | Freq: Once | SUBCUTANEOUS | Status: DC

## 2024-10-28 MED ORDER — OXYTOCIN BOLUS FROM INFUSION
333.0000 mL | Freq: Once | INTRAVENOUS | Status: AC
  Administered 2024-10-28: 333 mL via INTRAVENOUS

## 2024-10-28 MED ORDER — LACTATED RINGERS IV SOLN: 500.0000 mL | Freq: Once | INTRAVENOUS | Status: DC

## 2024-10-28 MED ORDER — CEFAZOLIN SODIUM-DEXTROSE 2-4 GM/100ML-% IV SOLN
2.0000 g | Freq: Once | INTRAVENOUS | Status: AC
  Administered 2024-10-28: 2 g via INTRAVENOUS
  Filled 2024-10-28: qty 100

## 2024-10-28 MED ORDER — ONDANSETRON HCL 4 MG/2ML IJ SOLN: 4.0000 mg | INTRAMUSCULAR | Status: DC | PRN

## 2024-10-28 MED ORDER — FENTANYL-BUPIVACAINE-NACL 0.5-0.125-0.9 MG/250ML-% EP SOLN: 12.0000 mL/h | EPIDURAL | Status: DC | PRN

## 2024-10-28 MED ORDER — EPHEDRINE 5 MG/ML INJ: 10.0000 mg | INTRAVENOUS | Status: DC | PRN

## 2024-10-28 MED ORDER — LACTATED RINGERS IV SOLN: INTRAVENOUS | Status: DC

## 2024-10-28 MED ORDER — FLEET ENEMA RE ENEM: 1.0000 | ENEMA | RECTAL | Status: DC | PRN

## 2024-10-28 MED ORDER — OXYCODONE-ACETAMINOPHEN 5-325 MG PO TABS: 2.0000 | ORAL_TABLET | ORAL | Status: DC | PRN

## 2024-10-28 MED ORDER — PRENATAL MULTIVITAMIN CH
1.0000 | ORAL_TABLET | Freq: Every day | ORAL | Status: DC
  Administered 2024-10-29 – 2024-10-30 (×2): 1 via ORAL
  Filled 2024-10-28 (×2): qty 1

## 2024-10-28 MED ORDER — LACTATED RINGERS IV SOLN
500.0000 mL | INTRAVENOUS | Status: DC | PRN
  Administered 2024-10-28: 500 mL via INTRAVENOUS

## 2024-10-28 MED ORDER — DIPHENHYDRAMINE HCL 25 MG PO CAPS: 25.0000 mg | ORAL_CAPSULE | Freq: Four times a day (QID) | ORAL | Status: DC | PRN

## 2024-10-28 MED ORDER — SENNOSIDES-DOCUSATE SODIUM 8.6-50 MG PO TABS
2.0000 | ORAL_TABLET | Freq: Every day | ORAL | Status: DC
  Administered 2024-10-29 – 2024-10-30 (×2): 2 via ORAL
  Filled 2024-10-28 (×2): qty 2

## 2024-10-28 MED ORDER — LIDOCAINE-EPINEPHRINE (PF) 2 %-1:200000 IJ SOLN
INTRAMUSCULAR | Status: DC | PRN
  Administered 2024-10-28: 3 mL via EPIDURAL

## 2024-10-28 MED ORDER — FENTANYL-BUPIVACAINE-NACL 0.5-0.125-0.9 MG/250ML-% EP SOLN
12.0000 mL/h | EPIDURAL | Status: DC | PRN
  Administered 2024-10-28: 12 mL/h via EPIDURAL

## 2024-10-28 MED ORDER — LIDOCAINE HCL (PF) 1 % IJ SOLN: 30.0000 mL | INTRAMUSCULAR | Status: DC | PRN

## 2024-10-28 MED ORDER — ACETAMINOPHEN 500 MG PO TABS: 1000.0000 mg | ORAL_TABLET | Freq: Four times a day (QID) | ORAL | Status: DC | PRN

## 2024-10-28 MED ORDER — ONDANSETRON HCL 4 MG/2ML IJ SOLN
4.0000 mg | Freq: Four times a day (QID) | INTRAMUSCULAR | Status: DC | PRN
  Administered 2024-10-28: 4 mg via INTRAVENOUS
  Filled 2024-10-28: qty 2

## 2024-10-28 MED ORDER — PHENYLEPHRINE 80 MCG/ML (10ML) SYRINGE FOR IV PUSH (FOR BLOOD PRESSURE SUPPORT): 80.0000 ug | PREFILLED_SYRINGE | INTRAVENOUS | Status: DC | PRN

## 2024-10-28 MED ORDER — SIMETHICONE 80 MG PO CHEW: 80.0000 mg | CHEWABLE_TABLET | ORAL | Status: DC | PRN

## 2024-10-28 MED ORDER — BENZOCAINE-MENTHOL 20-0.5 % EX AERO
1.0000 | INHALATION_SPRAY | CUTANEOUS | Status: DC | PRN
  Administered 2024-10-29: 1 via TOPICAL
  Filled 2024-10-28: qty 56

## 2024-10-28 MED ORDER — FENTANYL-BUPIVACAINE-NACL 0.5-0.125-0.9 MG/250ML-% EP SOLN
EPIDURAL | Status: AC
  Filled 2024-10-28: qty 250

## 2024-10-28 MED ORDER — OXYTOCIN-SODIUM CHLORIDE 30-0.9 UT/500ML-% IV SOLN
2.5000 [IU]/h | INTRAVENOUS | Status: DC
  Administered 2024-10-28 – 2024-10-29 (×2): 2.5 [IU]/h via INTRAVENOUS
  Filled 2024-10-28: qty 500

## 2024-10-28 MED ORDER — OXYCODONE-ACETAMINOPHEN 5-325 MG PO TABS: 1.0000 | ORAL_TABLET | ORAL | Status: DC | PRN

## 2024-10-28 MED ORDER — ACETAMINOPHEN 325 MG PO TABS: 650.0000 mg | ORAL_TABLET | ORAL | Status: DC | PRN

## 2024-10-28 MED ORDER — ONDANSETRON HCL 4 MG PO TABS: 4.0000 mg | ORAL_TABLET | ORAL | Status: DC | PRN

## 2024-10-28 MED ORDER — SOD CITRATE-CITRIC ACID 500-334 MG/5ML PO SOLN: 30.0000 mL | ORAL | Status: DC | PRN

## 2024-10-28 MED ORDER — IBUPROFEN 600 MG PO TABS
600.0000 mg | ORAL_TABLET | Freq: Four times a day (QID) | ORAL | Status: DC
  Administered 2024-10-29 – 2024-10-30 (×7): 600 mg via ORAL
  Filled 2024-10-28 (×7): qty 1

## 2024-10-28 MED ORDER — TETANUS-DIPHTH-ACELL PERTUSSIS 5-2-15.5 LF-MCG/0.5 IM SUSP
0.5000 mL | Freq: Once | INTRAMUSCULAR | Status: AC
  Administered 2024-10-29: 0.5 mL via INTRAMUSCULAR
  Filled 2024-10-28: qty 0.5

## 2024-10-28 MED ORDER — DIPHENHYDRAMINE HCL 50 MG/ML IJ SOLN: 12.5000 mg | INTRAMUSCULAR | Status: DC | PRN

## 2024-10-28 MED ORDER — PHENYLEPHRINE 80 MCG/ML (10ML) SYRINGE FOR IV PUSH (FOR BLOOD PRESSURE SUPPORT)
80.0000 ug | PREFILLED_SYRINGE | INTRAVENOUS | Status: DC | PRN
  Filled 2024-10-28: qty 10

## 2024-10-28 MED ORDER — WITCH HAZEL-GLYCERIN EX PADS: 1.0000 | MEDICATED_PAD | CUTANEOUS | Status: DC | PRN

## 2024-10-28 MED ORDER — TERBUTALINE SULFATE 1 MG/ML IJ SOLN
INTRAMUSCULAR | Status: AC
  Filled 2024-10-28: qty 1

## 2024-10-28 MED ORDER — CEFAZOLIN SODIUM-DEXTROSE 1-4 GM/50ML-% IV SOLN: 1.0000 g | Freq: Three times a day (TID) | INTRAVENOUS | Status: DC

## 2024-10-28 MED ORDER — DIBUCAINE (PERIANAL) 1 % EX OINT: 1.0000 | TOPICAL_OINTMENT | CUTANEOUS | Status: DC | PRN

## 2024-10-28 MED ORDER — COCONUT OIL OIL: 1.0000 | TOPICAL_OIL | Status: DC | PRN

## 2024-10-28 NOTE — Anesthesia Procedure Notes (Signed)
 Epidural Patient location during procedure: OB Start time: 10/28/2024 8:46 PM End time: 10/28/2024 8:51 PM  Staffing Anesthesiologist: Tilford Franky BIRCH, MD Performed: anesthesiologist   Preanesthetic Checklist Completed: patient identified, IV checked, site marked, risks and benefits discussed, surgical consent, monitors and equipment checked, pre-op evaluation and timeout performed  Epidural Patient position: sitting Prep: DuraPrep Patient monitoring: heart rate, continuous pulse ox and blood pressure Approach: midline Location: L3-L4 Injection technique: LOR saline  Needle:  Needle type: Tuohy  Needle gauge: 17 G Needle length: 9 cm Catheter type: closed end flexible Catheter size: 20 Guage Test dose: negative and 1.5% lidocaine   Assessment Events: blood not aspirated, no cerebrospinal fluid, injection not painful, no injection resistance and no paresthesia  Additional Notes LOR @ 6  Patient identified. Risks/Benefits/Options discussed with patient including but not limited to bleeding, infection, nerve damage, paralysis, failed block, incomplete pain control, headache, blood pressure changes, nausea, vomiting, reactions to medications, itching and postpartum back pain. Confirmed with bedside nurse the patient's most recent platelet count. Confirmed with patient that they are not currently taking any anticoagulation, have any bleeding history or any family history of bleeding disorders. Patient expressed understanding and wished to proceed. All questions were answered. Sterile technique was used throughout the entire procedure. Please see nursing notes for vital signs. Test dose was given through epidural catheter and negative prior to continuing to dose epidural or start infusion. Warning signs of high block given to the patient including shortness of breath, tingling/numbness in hands, complete motor block, or any concerning symptoms with instructions to call for help. Patient  was given instructions on fall risk and not to get out of bed. All questions and concerns addressed with instructions to call with any issues or inadequate analgesia.    Reason for block:procedure for pain

## 2024-10-28 NOTE — MAU Note (Signed)
 Kara Castillo is a 28 y.o. at [redacted]w[redacted]d here in MAU reporting: contractions since this afternoon but they have spaced out. She reports +FMs, Denies LOF, VB, blurry vision, headaches, peripheral edema, or RUQ pain. Pt reports smoking marijuana today when contractions started.  2cm in office this past week LMP: - Onset of complaint: this afternoon Pain score: 7/10 Vitals:   10/28/24 1802  BP: 126/72  Pulse: 84  Resp: 16  Temp: 97.9 F (36.6 C)  SpO2: 96%     FHT: 155  Lab orders placed from triage: labor triage
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
## 2024-10-28 NOTE — Anesthesia Preprocedure Evaluation (Addendum)
 Anesthesia Evaluation  Patient identified by MRN, date of birth, ID band Patient awake    Reviewed: Allergy & Precautions, Patient's Chart, lab work & pertinent test results  Airway Mallampati: II       Dental no notable dental hx.    Pulmonary Current Smoker   Pulmonary exam normal        Cardiovascular Normal cardiovascular exam     Neuro/Psych  PSYCHIATRIC DISORDERS Anxiety Depression       GI/Hepatic   Endo/Other    Renal/GU      Musculoskeletal   Abdominal   Peds  Hematology   Anesthesia Other Findings   Reproductive/Obstetrics (+) Pregnancy                              Anesthesia Physical Anesthesia Plan  ASA: 2  Anesthesia Plan: Epidural   Post-op Pain Management:    Induction:   PONV Risk Score and Plan: 0  Airway Management Planned: Natural Airway  Additional Equipment: None  Intra-op Plan:   Post-operative Plan:   Informed Consent: I have reviewed the patients History and Physical, chart, labs and discussed the procedure including the risks, benefits and alternatives for the proposed anesthesia with the patient or authorized representative who has indicated his/her understanding and acceptance.       Plan Discussed with:   Anesthesia Plan Comments: (Lab Results      Component                Value               Date                      WBC                      12.6 (H)            10/28/2024                HGB                      11.2 (L)            10/28/2024                HCT                      33.9 (L)            10/28/2024                MCV                      96.0                10/28/2024                PLT                      415 (H)             10/28/2024           )         Anesthesia Quick Evaluation
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
## 2024-10-29 LAB — SYPHILIS: RPR W/REFLEX TO RPR TITER AND TREPONEMAL ANTIBODIES, TRADITIONAL SCREENING AND DIAGNOSIS ALGORITHM: RPR Ser Ql: NONREACTIVE

## 2024-10-29 MED ORDER — RHO D IMMUNE GLOBULIN 1500 UNIT/2ML IJ SOSY
300.0000 ug | PREFILLED_SYRINGE | Freq: Once | INTRAMUSCULAR | Status: AC
  Administered 2024-10-29: 300 ug via INTRAVENOUS
  Filled 2024-10-29: qty 2

## 2024-10-29 NOTE — Anesthesia Postprocedure Evaluation (Signed)
 Anesthesia Post Note  Patient: Kara Castillo  Procedure(s) Performed: AN AD HOC LABOR EPIDURAL     Patient location during evaluation: Mother Baby Anesthesia Type: Epidural Level of consciousness: awake and alert Pain management: pain level controlled Vital Signs Assessment: post-procedure vital signs reviewed and stable Respiratory status: spontaneous breathing, nonlabored ventilation and respiratory function stable Cardiovascular status: stable Postop Assessment: no headache, no backache, epidural receding, no apparent nausea or vomiting, patient able to bend at knees, able to ambulate and adequate PO intake Anesthetic complications: no   No notable events documented.  Last Vitals:  Vitals:   10/29/24 0133 10/29/24 0537  BP: (!) 111/58 132/77  Pulse: 77 (!) 58  Resp: 18 18  Temp: 36.8 C 36.7 C  SpO2: 95% 99%    Last Pain:  Vitals:   10/29/24 0802  TempSrc:   PainSc: 0-No pain   Pain Goal:                Epidural/Spinal Function Cutaneous sensation: Normal sensation (10/29/24 0758), Patient able to flex knees: Yes (10/29/24 0758), Patient able to lift hips off bed: Yes (10/29/24 0758), Back pain beyond tenderness at insertion site: No (10/29/24 0758), Progressively worsening motor and/or sensory loss: No (10/29/24 0758), Bowel and/or bladder incontinence post epidural: No (10/29/24 0758)  Thomos Janetta Lout
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
## 2024-10-30 ENCOUNTER — Other Ambulatory Visit (HOSPITAL_COMMUNITY): Payer: Self-pay

## 2024-10-30 LAB — RH IG WORKUP (INCLUDES ABO/RH)
Fetal Screen: NEGATIVE
Gestational Age(Wks): 39
Unit division: 0

## 2024-10-30 MED ORDER — ACETAMINOPHEN 500 MG PO TABS
1000.0000 mg | ORAL_TABLET | Freq: Four times a day (QID) | ORAL | 0 refills | Status: AC | PRN
  Filled 2024-10-30: qty 60, 8d supply, fill #0

## 2024-10-30 MED ORDER — NORETHINDRONE 0.35 MG PO TABS
1.0000 | ORAL_TABLET | Freq: Every day | ORAL | 0 refills | Status: AC
  Filled 2024-10-30: qty 28, 28d supply, fill #0

## 2024-10-30 MED ORDER — IBUPROFEN 600 MG PO TABS
600.0000 mg | ORAL_TABLET | Freq: Four times a day (QID) | ORAL | 0 refills | Status: AC
  Filled 2024-10-30: qty 30, 8d supply, fill #0

## 2024-10-30 NOTE — Patient Instructions (Signed)
 If you are interested in an outpatient lactation consultation -- available in-office or virtually -- please reach out to us  at:  [facility] (First Floor) ?? [address], [place], [place]  ?? [phone] Please leave a message on our lactation voicemail box. We welcome any lactation-related questions or concerns -- our team is here to support you and your baby.  Lactation Support Groups Join us  at: [facility] ?? Tuesdays, 10:00 AM - 12:00 PM ?? [address], [place] [employer], [place]  Lactating parents and lap babies are welcome!  ?? [email or web address]  ?? [email or web address] -------------  Si est interesado en una consulta ambulatoria de lactancia, disponible en el consultorio o virtualmente, comunquese con nosotros en:  [facility] ?? [address], [place], [place]  ?? [phone] Por favor, deje un mensaje en nuestro buzn de voz de lactancia. Estamos aqu para responder cualquier pregunta o inquietud relacionada con la lactancia y para apoyarle a usted y a su beb.  Grupos de Apoyo para la Lactancia nase a nosotros en: [facility] ?? Martes, de 10:00 a. m. a 12:00 p. m. ?? [address], [place], [place]  Se admiten madres lactantes y bebs en regazo.  ?? [email or web address]  ?? [email or web address]      Kara Castillo, [facility]
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
## 2024-10-30 NOTE — Lactation Note (Signed)
 This note was copied from a baby's chart. Lactation Consultation Note  Patient Name: Kara Castillo Unijb'd Date: 10/30/2024 Age:27 hours Reason for consult: Follow-up assessment;Term  P2, Baby cueing.  Mother hand expressed drops of transitional breastmilk. Assisted with latching in football hold on both breasts. Discussed since baby is SGA, discussed pumping in addition if baby has 5 minute feedings and becomes sleepy at the breast. Reviewed engorgement care and monitoring voids/stools. Resent [facility] pump referral.  Maternal Data Has patient been taught Hand Expression?: Yes  Feeding Mother's Current Feeding Choice: Breast Milk  LATCH Score Latch: Grasps breast easily, tongue down, lips flanged, rhythmical sucking.  Audible Swallowing: A few with stimulation  Type of Nipple: Everted at rest and after stimulation  Comfort (Breast/Nipple): Soft / non-tender  Hold (Positioning): Assistance needed to correctly position infant at breast and maintain latch.  LATCH Score: 8   Lactation Tools Discussed/Used    Interventions Interventions: Breast feeding basics reviewed;Assisted with latch;Education  Discharge Discharge Education: Engorgement and breast care;Warning signs for feeding baby Pump: Referral sent for [facility] Pump  Consult Status Consult Status: Complete Date: 10/30/24    Shannon Dines [facility] 10/30/2024, 12:16 PM

## 2024-10-30 NOTE — Lactation Note (Signed)
 This note was copied from a baby's chart. Lactation Consultation Note  Patient Name: Kara Castillo Unijb'd Date: 10/30/2024 Age:27 hours Reason for consult: Initial assessment;Term  P2. Experienced BF mom concerned d/t baby won't latch. Baby is alert but not hungry. Baby gagging frequently and finally spit some clear fluid emesis w/some frothy emesis as well. LC used bulb syringe to clear mouth. Noted hand very jittery. Glucose 84. Mom is going to hold baby STS.LC opened T-shirt for STS. Newborn feeding habits, STS, I&O, positioning, body alignment reviewed. Mom just needed a little refresher on newborn feeding again. Mom encouraged to feed baby 8-12 times/24 hours and with feeding cues.  Answered mom's questions. Encouraged if needs assistance call. [facility] sent request for [facility] pump. Maternal Data Has patient been taught Hand Expression?: Yes Does the patient have breastfeeding experience prior to this delivery?: Yes How long did the patient breastfeed?: 1 yr to her now 27 yr old  Feeding    LATCH Score Latch: Too sleepy or reluctant, no latch achieved, no sucking elicited.  Audible Swallowing: None  Type of Nipple: Everted at rest and after stimulation  Comfort (Breast/Nipple): Soft / non-tender  Hold (Positioning): No assistance needed to correctly position infant at breast.  LATCH Score: 6   Lactation Tools Discussed/Used    Interventions Interventions: Breast feeding basics reviewed;Assisted with latch;Skin to skin;Breast massage;Hand express;Breast compression;Adjust position;Support pillows;Position options;Education;LC Services brochure  Discharge Pump: Referral sent for [facility] Pump  Consult Status Consult Status: Follow-up Date: 10/31/24 Follow-up type: In-patient    [name] G 10/30/2024, 3:42 AM

## 2024-11-01 ENCOUNTER — Encounter: Admitting: Obstetrics and Gynecology

## 2024-11-06 ENCOUNTER — Inpatient Hospital Stay (HOSPITAL_COMMUNITY): Admission: RE | Admit: 2024-11-06 | Source: Home / Self Care | Admitting: Obstetrics and Gynecology

## 2024-11-06 ENCOUNTER — Inpatient Hospital Stay (HOSPITAL_COMMUNITY)

## 2024-11-07 ENCOUNTER — Telehealth (HOSPITAL_COMMUNITY): Payer: Self-pay

## 2024-11-07 ENCOUNTER — Ambulatory Visit: Admitting: Licensed Clinical Social Worker

## 2024-11-07 NOTE — BH Specialist Note (Unsigned)
 Integrated Behavioral Health via Telemedicine Visit  11/07/2024 Aftan Vint [id number]  Number of Integrated Behavioral Health Clinician visits: 3- Third Visit  Session Start time: 0815   Session End time: 0856  Total time in minutes: 41    Referring Provider: *** Patient/Family location: [facility] Provider location: *** All persons participating in visit: *** Types of Service: {CHL AMB TYPE OF SERVICE:[phone]}  I connected with [name] and/or Vanessa Douthitt's {family members:20773} via  Telephone or Engineer, Civil (consulting)  (Video is Caregility application) and verified that I am speaking with the correct person using two identifiers. Discussed confidentiality: {YES/NO:21197}  I discussed the limitations of telemedicine and the availability of in person appointments.  Discussed there is a possibility of technology failure and discussed alternative modes of communication if that failure occurs.  I discussed that engaging in this telemedicine visit, they consent to the provision of behavioral healthcare and the services will be billed under their insurance.  Patient and/or legal guardian expressed understanding and consented to Telemedicine visit: {YES/NO:21197}  Presenting Concerns: Patient and/or family reports the following symptoms/concerns: *** Duration of problem: ***; Severity of problem: {Mild/Moderate/Severe:20260}  Patient and/or Family's Strengths/Protective Factors: {CHL AMB BH PROTECTIVE FACTORS:([phone]}  Goals Addressed: Patient will:  Reduce symptoms of: {IBH Symptoms:21014056}   Increase knowledge and/or ability of: {IBH Patient Tools:21014057}   Demonstrate ability to: {IBH Goals:21014053}  Progress towards Goals: {CHL AMB BH PROGRESS TOWARDS GOALS:[phone]}    Interventions: Interventions utilized:  {IBH Interventions:21014054} Standardized Assessments completed: {IBH Screening Tools:21014051}    Patient and/or Family  Response: The end of pregnancy was great.. she ended up stop working and was able to focus more on her pregnancy..   She labored well at home... She walked in the hospital feeling comfortable. Was admitted and couldn't get an epidulral in MAU. There were no rooms upstairs at the time aviable. Was not mentally prepared.   Nurse Zada, she had a great nurse, radiating positivity and was very caring. Got the epidural and had her baby..   Did take the cotton balls out of her butt, said they were going to drug screen her bc she admitted that she was smoking marijuana.. Then wanted to go home  She had group  b strep but didn't get enough of it  Nurse came in and mistaken her for another patient heard her laugh at it in the hallway.   Coping skills have been writing.SABRA journaling things out, writing to chatgpt.   Father was able to meet her daughter, he does not have transportation and does not always show up the way she would like.SABRA Gartner like her daughter deserves more..  Clinical Assessment/Diagnosis  No diagnosis found.    Assessment: Patient currently experiencing ***.   Patient may benefit from ***.  Plan: Follow up with behavioral health clinician on : *** Behavioral recommendations: *** Referral(s): {IBH Referrals:21014055}  I discussed the assessment and treatment plan with the patient and/or parent/guardian. They were provided an opportunity to ask questions and all were answered. They agreed with the plan and demonstrated an understanding of the instructions.   They were advised to call back or seek an in-person evaluation if the symptoms worsen or if the condition fails to improve as anticipated.  Marcelyn LITTIE Seats, [place]   Integrated Behavioral Health via Telemedicine Visit  11/07/2024 Keonia Pasko [id number]  Number of Integrated Behavioral Health Clinician visits: 3- Third Visit  Session Start time: 0815   Session End time: 0856  Total time in  minutes:  41    Referring Provider: *** Patient/Family location: [facility] Provider location: *** All persons participating in visit: *** Types of Service: {CHL AMB TYPE OF SERVICE:[phone]}  I connected with [name] and/or Vanessa Siddoway's {family members:20773} via  Telephone or Engineer, Civil (consulting)  (Video is Caregility application) and verified that I am speaking with the correct person using two identifiers. Discussed confidentiality: {YES/NO:21197}  I discussed the limitations of telemedicine and the availability of in person appointments.  Discussed there is a possibility of technology failure and discussed alternative modes of communication if that failure occurs.  I discussed that engaging in this telemedicine visit, they consent to the provision of behavioral healthcare and the services will be billed under their insurance.  Patient and/or legal guardian expressed understanding and consented to Telemedicine visit: {YES/NO:21197}  Presenting Concerns: Patient and/or family reports the following symptoms/concerns: *** Duration of problem: ***; Severity of problem: {Mild/Moderate/Severe:20260}  Patient and/or Family's Strengths/Protective Factors: {CHL AMB BH PROTECTIVE FACTORS:[phone]}  Goals Addressed: Patient will:  Reduce symptoms of: {IBH Symptoms:21014056}   Increase knowledge and/or ability of: {IBH Patient Tools:21014057}   Demonstrate ability to: {IBH Goals:21014053}  Progress towards Goals: {CHL AMB BH PROGRESS TOWARDS GOALS:[id number]}    Interventions: Interventions utilized:  {IBH Interventions:21014054} Standardized Assessments completed: {IBH Screening Tools:21014051}    Patient and/or Family Response: ***  Clinical Assessment/Diagnosis  No diagnosis found.    Assessment: Patient currently experiencing ***.   Patient may benefit from ***.  Plan: Follow up with behavioral health clinician on : *** Behavioral recommendations:  *** Referral(s): {IBH Referrals:21014055}  I discussed the assessment and treatment plan with the patient and/or parent/guardian. They were provided an opportunity to ask questions and all were answered. They agreed with the plan and demonstrated an understanding of the instructions.   They were advised to call back or seek an in-person evaluation if the symptoms worsen or if the condition fails to improve as anticipated.  Adina Puzzo LITTIE Seats, LCSWA

## 2024-11-07 NOTE — Telephone Encounter (Signed)
 11/07/2024 1518  Name: Kara Castillo MRN: [id number] DOB: [birth year]  Reason for Call:  Transition of Care Hospital Discharge Call  Contact Status: Patient Contact Status: Complete  Language assistant needed:          Follow-Up Questions: Do You Have Any Concerns About Your Health As You Heal From Delivery?: No Do You Have Any Concerns About Your Infants Health?: Yes What Concerns Do You Have About Your Baby?: Patient asks about removing boogers from nose. RN reviewed using bulb syringe use. Patient has no other concerns or questions.  Edinburgh Postnatal Depression Scale:  In the Past 7 Days: I have been able to laugh and see the funny side of things.: As much as I always could I have looked forward with enjoyment to things.: As much as I ever did I have blamed myself unnecessarily when things went wrong.: Not very often I have been anxious or worried for no good reason.: Hardly ever I have felt scared or panicky for no good reason.: No, not at all Things have been getting on top of me.: No, most of the time I have coped quite well I have been so unhappy that I have had difficulty sleeping.: Not at all I have felt sad or miserable.: No, not at all I have been so unhappy that I have been crying.: Only occasionally The thought of harming myself has occurred to me.: Never Edinburgh Postnatal Depression Scale Total: 4  PHQ2-9 Depression Scale:     Discharge Follow-up: Edinburgh score requires follow up?: No Patient was advised of the following resources:: Breastfeeding Support Group, Support Group  Post-discharge interventions: NA  Signature  Rosaline Deretha PEAK

## 2024-11-14 ENCOUNTER — Encounter: Admitting: Licensed Clinical Social Worker

## 2024-12-11 ENCOUNTER — Ambulatory Visit: Payer: Self-pay | Admitting: Obstetrics and Gynecology

## 2025-01-01 ENCOUNTER — Ambulatory Visit: Payer: Self-pay | Admitting: Obstetrics

## 2025-01-09 ENCOUNTER — Ambulatory Visit: Payer: Self-pay | Admitting: Obstetrics
